# Patient Record
Sex: Female | Born: 1960 | Race: White | Hispanic: No | Marital: Married | Smoking: Never smoker
Health system: Southern US, Community
[De-identification: ages and names within clinical notes are randomized; demographics above are authoritative.]

## PROBLEM LIST (undated history)

## (undated) DIAGNOSIS — E669 Obesity, unspecified: Secondary | ICD-10-CM

## (undated) DIAGNOSIS — R5381 Other malaise: Secondary | ICD-10-CM

## (undated) DIAGNOSIS — E785 Hyperlipidemia, unspecified: Secondary | ICD-10-CM

## (undated) DIAGNOSIS — N943 Premenstrual tension syndrome: Secondary | ICD-10-CM

## (undated) DIAGNOSIS — E039 Hypothyroidism, unspecified: Secondary | ICD-10-CM

## (undated) DIAGNOSIS — I10 Essential (primary) hypertension: Secondary | ICD-10-CM

## (undated) DIAGNOSIS — Z0000 Encounter for general adult medical examination without abnormal findings: Secondary | ICD-10-CM

## (undated) DIAGNOSIS — I8290 Acute embolism and thrombosis of unspecified vein: Secondary | ICD-10-CM

## (undated) DIAGNOSIS — C189 Malignant neoplasm of colon, unspecified: Secondary | ICD-10-CM

## (undated) DIAGNOSIS — F329 Major depressive disorder, single episode, unspecified: Secondary | ICD-10-CM

## (undated) DIAGNOSIS — R5383 Other fatigue: Secondary | ICD-10-CM

## (undated) DIAGNOSIS — C801 Malignant (primary) neoplasm, unspecified: Secondary | ICD-10-CM

## (undated) DIAGNOSIS — R635 Abnormal weight gain: Secondary | ICD-10-CM

## (undated) HISTORY — DX: Obesity, unspecified: E66.9

## (undated) HISTORY — PX: SINUSOTOMY: SHX291

## (undated) HISTORY — DX: Essential (primary) hypertension: I10

## (undated) HISTORY — DX: Major depressive disorder, single episode, unspecified: F32.9

## (undated) HISTORY — DX: Hypothyroidism, unspecified: E03.9

## (undated) HISTORY — DX: Premenstrual tension syndrome: N94.3

## (undated) HISTORY — DX: Hyperlipidemia, unspecified: E78.5

## (undated) HISTORY — PX: APPENDECTOMY: SHX54

## (undated) HISTORY — PX: CHOLECYSTECTOMY: SHX55

## (undated) HISTORY — DX: Encounter for general adult medical examination without abnormal findings: Z00.00

## (undated) HISTORY — PX: LUMBAR DISC SURGERY: SHX700

## (undated) HISTORY — DX: Acute embolism and thrombosis of unspecified vein: I82.90

## (undated) HISTORY — DX: Other malaise: R53.81

## (undated) HISTORY — DX: Other fatigue: R53.83

## (undated) HISTORY — DX: Malignant neoplasm of colon, unspecified: C18.9

## (undated) HISTORY — DX: Abnormal weight gain: R63.5

## (undated) HISTORY — PX: TONSILLECTOMY: SUR1361

## (undated) HISTORY — PX: SHOULDER ARTHROSCOPY: SHX128

---
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
## 1998-01-27 ENCOUNTER — Ambulatory Visit (HOSPITAL_COMMUNITY): Admission: RE | Admit: 1998-01-27 | Discharge: 1998-01-27 | Payer: Self-pay | Admitting: Obstetrics and Gynecology

## 1998-03-24 ENCOUNTER — Ambulatory Visit (HOSPITAL_COMMUNITY): Admission: RE | Admit: 1998-03-24 | Discharge: 1998-03-24 | Payer: Self-pay | Admitting: Obstetrics and Gynecology

## 1998-06-14 ENCOUNTER — Inpatient Hospital Stay (HOSPITAL_COMMUNITY): Admission: AD | Admit: 1998-06-14 | Discharge: 1998-06-14 | Payer: Self-pay | Admitting: Obstetrics and Gynecology

## 1998-06-16 ENCOUNTER — Inpatient Hospital Stay (HOSPITAL_COMMUNITY): Admission: AD | Admit: 1998-06-16 | Discharge: 1998-06-20 | Payer: Self-pay | Admitting: Obstetrics and Gynecology

## 1999-09-07 ENCOUNTER — Emergency Department (HOSPITAL_COMMUNITY): Admission: EM | Admit: 1999-09-07 | Discharge: 1999-09-07 | Payer: Self-pay | Admitting: Emergency Medicine

## 1999-09-14 ENCOUNTER — Encounter: Admission: RE | Admit: 1999-09-14 | Discharge: 1999-09-14 | Payer: Self-pay | Admitting: Internal Medicine

## 1999-09-16 ENCOUNTER — Ambulatory Visit (HOSPITAL_COMMUNITY): Admission: RE | Admit: 1999-09-16 | Discharge: 1999-09-16 | Payer: Self-pay | Admitting: *Deleted

## 1999-09-18 ENCOUNTER — Encounter: Admission: RE | Admit: 1999-09-18 | Discharge: 1999-09-18 | Payer: Self-pay | Admitting: Internal Medicine

## 1999-09-21 ENCOUNTER — Ambulatory Visit: Admission: RE | Admit: 1999-09-21 | Discharge: 1999-09-21 | Payer: Self-pay | Admitting: *Deleted

## 1999-10-20 ENCOUNTER — Ambulatory Visit (HOSPITAL_COMMUNITY): Admission: RE | Admit: 1999-10-20 | Discharge: 1999-10-21 | Payer: Self-pay | Admitting: General Surgery

## 1999-10-20 ENCOUNTER — Encounter: Payer: Self-pay | Admitting: General Surgery

## 2000-02-02 ENCOUNTER — Other Ambulatory Visit: Admission: RE | Admit: 2000-02-02 | Discharge: 2000-02-02 | Payer: Self-pay | Admitting: Obstetrics and Gynecology

## 2001-04-17 ENCOUNTER — Encounter: Payer: Self-pay | Admitting: Obstetrics and Gynecology

## 2001-04-17 ENCOUNTER — Ambulatory Visit (HOSPITAL_COMMUNITY): Admission: RE | Admit: 2001-04-17 | Discharge: 2001-04-17 | Payer: Self-pay | Admitting: Obstetrics and Gynecology

## 2001-06-19 ENCOUNTER — Inpatient Hospital Stay (HOSPITAL_COMMUNITY): Admission: AD | Admit: 2001-06-19 | Discharge: 2001-06-19 | Payer: Self-pay | Admitting: Obstetrics and Gynecology

## 2001-07-12 ENCOUNTER — Inpatient Hospital Stay (HOSPITAL_COMMUNITY): Admission: AD | Admit: 2001-07-12 | Discharge: 2001-07-12 | Payer: Self-pay | Admitting: Obstetrics and Gynecology

## 2001-09-11 ENCOUNTER — Inpatient Hospital Stay (HOSPITAL_COMMUNITY): Admission: AD | Admit: 2001-09-11 | Discharge: 2001-09-13 | Payer: Self-pay | Admitting: Obstetrics and Gynecology

## 2001-09-11 ENCOUNTER — Encounter (INDEPENDENT_AMBULATORY_CARE_PROVIDER_SITE_OTHER): Payer: Self-pay | Admitting: Specialist

## 2003-10-03 ENCOUNTER — Emergency Department (HOSPITAL_COMMUNITY): Admission: EM | Admit: 2003-10-03 | Discharge: 2003-10-03 | Payer: Self-pay | Admitting: Emergency Medicine

## 2005-06-03 ENCOUNTER — Other Ambulatory Visit: Admission: RE | Admit: 2005-06-03 | Discharge: 2005-06-03 | Payer: Self-pay | Admitting: Obstetrics and Gynecology

## 2007-06-19 ENCOUNTER — Encounter: Payer: Self-pay | Admitting: Endocrinology

## 2007-07-19 ENCOUNTER — Encounter: Payer: Self-pay | Admitting: Endocrinology

## 2007-10-10 ENCOUNTER — Encounter: Payer: Self-pay | Admitting: Endocrinology

## 2007-10-26 ENCOUNTER — Encounter: Admission: RE | Admit: 2007-10-26 | Discharge: 2007-11-16 | Payer: Self-pay | Admitting: Family Medicine

## 2007-11-30 ENCOUNTER — Ambulatory Visit: Payer: Self-pay | Admitting: Endocrinology

## 2007-11-30 DIAGNOSIS — F3289 Other specified depressive episodes: Secondary | ICD-10-CM

## 2007-11-30 DIAGNOSIS — F329 Major depressive disorder, single episode, unspecified: Secondary | ICD-10-CM

## 2007-11-30 DIAGNOSIS — R635 Abnormal weight gain: Secondary | ICD-10-CM

## 2007-11-30 DIAGNOSIS — E039 Hypothyroidism, unspecified: Secondary | ICD-10-CM | POA: Insufficient documentation

## 2007-11-30 HISTORY — DX: Major depressive disorder, single episode, unspecified: F32.9

## 2007-11-30 HISTORY — DX: Abnormal weight gain: R63.5

## 2007-11-30 HISTORY — DX: Other specified depressive episodes: F32.89

## 2007-12-01 ENCOUNTER — Ambulatory Visit: Payer: Self-pay | Admitting: Endocrinology

## 2007-12-01 ENCOUNTER — Telehealth (INDEPENDENT_AMBULATORY_CARE_PROVIDER_SITE_OTHER): Payer: Self-pay | Admitting: *Deleted

## 2007-12-01 LAB — CONVERTED CEMR LAB: Cortisol, Plasma: 0.5 ug/dL

## 2007-12-04 ENCOUNTER — Telehealth (INDEPENDENT_AMBULATORY_CARE_PROVIDER_SITE_OTHER): Payer: Self-pay | Admitting: *Deleted

## 2008-04-08 ENCOUNTER — Ambulatory Visit: Payer: Self-pay | Admitting: Family Medicine

## 2008-04-09 ENCOUNTER — Ambulatory Visit: Payer: Self-pay | Admitting: Family Medicine

## 2008-04-16 ENCOUNTER — Ambulatory Visit: Payer: Self-pay | Admitting: Family Medicine

## 2008-04-23 ENCOUNTER — Ambulatory Visit: Payer: Self-pay | Admitting: Family Medicine

## 2008-04-25 ENCOUNTER — Ambulatory Visit: Payer: Self-pay | Admitting: Family Medicine

## 2008-04-26 ENCOUNTER — Encounter (INDEPENDENT_AMBULATORY_CARE_PROVIDER_SITE_OTHER): Payer: Self-pay | Admitting: *Deleted

## 2008-04-26 LAB — CONVERTED CEMR LAB
ALT: 20 units/L (ref 0–35)
Albumin: 4.3 g/dL (ref 3.5–5.2)
Alkaline Phosphatase: 78 units/L (ref 39–117)
BUN: 12 mg/dL (ref 6–23)
Bilirubin, Direct: 0.1 mg/dL (ref 0.0–0.3)
CO2: 28 meq/L (ref 19–32)
Eosinophils Relative: 1.3 % (ref 0.0–5.0)
GFR calc Af Amer: 140 mL/min
Glucose, Bld: 80 mg/dL (ref 70–99)
HCT: 42.3 % (ref 36.0–46.0)
Hemoglobin: 14.8 g/dL (ref 12.0–15.0)
Lymphocytes Relative: 26.3 % (ref 12.0–46.0)
Monocytes Absolute: 0.5 10*3/uL (ref 0.1–1.0)
Monocytes Relative: 8.9 % (ref 3.0–12.0)
Neutro Abs: 3.5 10*3/uL (ref 1.4–7.7)
Platelets: 240 10*3/uL (ref 150–400)
Potassium: 3.9 meq/L (ref 3.5–5.1)
Sodium: 138 meq/L (ref 135–145)
Total Protein: 7.6 g/dL (ref 6.0–8.3)
WBC: 5.7 10*3/uL (ref 4.5–10.5)

## 2008-04-30 ENCOUNTER — Ambulatory Visit: Payer: Self-pay | Admitting: Family Medicine

## 2008-05-07 ENCOUNTER — Ambulatory Visit: Payer: Self-pay | Admitting: Family Medicine

## 2008-05-07 ENCOUNTER — Telehealth: Payer: Self-pay | Admitting: Family Medicine

## 2008-05-08 ENCOUNTER — Ambulatory Visit: Payer: Self-pay | Admitting: Family Medicine

## 2008-05-13 LAB — CONVERTED CEMR LAB
ALT: 18 units/L (ref 0–35)
AST: 23 units/L (ref 0–37)
Alkaline Phosphatase: 71 units/L (ref 39–117)
Basophils Absolute: 0 10*3/uL (ref 0.0–0.1)
Bilirubin, Direct: 0.1 mg/dL (ref 0.0–0.3)
CO2: 28 meq/L (ref 19–32)
Calcium: 9.4 mg/dL (ref 8.4–10.5)
Chloride: 109 meq/L (ref 96–112)
Glucose, Bld: 86 mg/dL (ref 70–99)
Hemoglobin: 14.7 g/dL (ref 12.0–15.0)
Lymphocytes Relative: 31.2 % (ref 12.0–46.0)
MCHC: 34.7 g/dL (ref 30.0–36.0)
Monocytes Relative: 8.4 % (ref 3.0–12.0)
Neutro Abs: 3 10*3/uL (ref 1.4–7.7)
Neutrophils Relative %: 57.8 % (ref 43.0–77.0)
Platelets: 237 10*3/uL (ref 150–400)
Potassium: 4.2 meq/L (ref 3.5–5.1)
RDW: 13.7 % (ref 11.5–14.6)
Sodium: 141 meq/L (ref 135–145)
Total Bilirubin: 0.6 mg/dL (ref 0.3–1.2)
Total Protein: 7 g/dL (ref 6.0–8.3)

## 2008-05-14 ENCOUNTER — Encounter (INDEPENDENT_AMBULATORY_CARE_PROVIDER_SITE_OTHER): Payer: Self-pay | Admitting: *Deleted

## 2008-05-14 ENCOUNTER — Ambulatory Visit: Payer: Self-pay | Admitting: Family Medicine

## 2008-05-21 ENCOUNTER — Ambulatory Visit: Payer: Self-pay | Admitting: Family Medicine

## 2008-05-28 ENCOUNTER — Ambulatory Visit: Payer: Self-pay | Admitting: Family Medicine

## 2008-06-04 ENCOUNTER — Ambulatory Visit: Payer: Self-pay | Admitting: Family Medicine

## 2008-06-11 ENCOUNTER — Ambulatory Visit: Payer: Self-pay | Admitting: Family Medicine

## 2008-06-18 ENCOUNTER — Ambulatory Visit: Payer: Self-pay | Admitting: Family Medicine

## 2008-06-25 ENCOUNTER — Ambulatory Visit: Payer: Self-pay | Admitting: Family Medicine

## 2008-07-02 ENCOUNTER — Ambulatory Visit: Payer: Self-pay | Admitting: Family Medicine

## 2008-07-09 ENCOUNTER — Ambulatory Visit: Payer: Self-pay | Admitting: Family Medicine

## 2008-07-23 ENCOUNTER — Ambulatory Visit: Payer: Self-pay | Admitting: Family Medicine

## 2008-07-30 ENCOUNTER — Ambulatory Visit: Payer: Self-pay | Admitting: Family Medicine

## 2008-08-06 ENCOUNTER — Ambulatory Visit: Payer: Self-pay | Admitting: Family Medicine

## 2009-11-20 ENCOUNTER — Emergency Department (HOSPITAL_COMMUNITY): Admission: EM | Admit: 2009-11-20 | Discharge: 2009-11-20 | Payer: Self-pay | Admitting: Emergency Medicine

## 2010-10-04 LAB — CONVERTED CEMR LAB
AST: 26 units/L (ref 0–37)
Alkaline Phosphatase: 80 units/L (ref 39–117)
Bilirubin Urine: NEGATIVE
Bilirubin, Direct: 0.1 mg/dL (ref 0.0–0.3)
Blood in Urine, dipstick: NEGATIVE
Chloride: 107 meq/L (ref 96–112)
Cholesterol: 236 mg/dL (ref 0–200)
Folate: 11.8 ng/mL
GFR calc Af Amer: 140 mL/min
GFR calc non Af Amer: 115 mL/min
Glucose, Urine, Semiquant: NEGATIVE
HDL: 41.1 mg/dL (ref >39.0)
Hemoglobin: 13.5 g/dL (ref 12.0–15.0)
Ketones, urine, test strip: NEGATIVE
Lymphocytes Relative: 32.6 % (ref 12.0–46.0)
Monocytes Relative: 4.4 % (ref 3.0–12.0)
Neutro Abs: 2.9 10*3/uL (ref 1.4–7.7)
Neutrophils Relative %: 58.4 % (ref 43.0–77.0)
Nitrite: NEGATIVE
Platelets: 281 10*3/uL (ref 150–400)
Potassium: 3.9 meq/L (ref 3.5–5.1)
Protein, U semiquant: NEGATIVE
RDW: 13.1 % (ref 11.5–14.6)
Sodium: 140 meq/L (ref 135–145)
Specific Gravity, Urine: 1.01
TSH: 1.55 microintl units/mL (ref 0.35–5.50)
Total Bilirubin: 0.7 mg/dL (ref 0.3–1.2)
Total CHOL/HDL Ratio: 5.7
Uric Acid, Serum: 5.4 mg/dL (ref 2.4–7.0)
Urobilinogen, UA: NEGATIVE
VLDL: 19 mg/dL (ref 0–40)
Vitamin B-12: 523 pg/mL (ref 211–911)
WBC Urine, dipstick: NEGATIVE
pH: 6

## 2011-01-22 NOTE — Discharge Summary (Signed)
[facility]  Patient:    Kaitlyn Avila, Kaitlyn Avila Visit Number: [id number] MRN: [id number]          Service Type: OBS Location: 910A 9108 01 Attending Physician:  Michaele Offer Dictated by:   Zenaida Niece, M.D. Admit Date:  09/11/2001 Discharge Date: 09/13/2001                             Discharge Summary  ADMISSION DIAGNOSES:          1. Intrauterine pregnancy at 39 weeks.                               2. Previous cesarean section.                               3. Hypothyroidism.                               4. Advanced maternal age.  DISCHARGE DIAGNOSES:          1. Intrauterine pregnancy at 39 weeks.                               2. Previous cesarean section.                               3. Hypothyroidism.                               4. Advanced maternal age.  PROCEDURE:                    Repeat low transverse cesarean section.  COMPLICATIONS:                None.  CONSULTATIONS:                None.  HISTORY AND PHYSICAL:         Please see the chart for a full history and physical.  Briefly, this is a 50 year old white female gravida 2, para 1-0-0-1 at 33 weeks admitted for a repeat low transverse cesarean section, as the patient declines a VBAC.  She has had well-controlled hypothyroidism throughout her pregnancy.  She is advanced maternal age and declined amniocentesis but had a normal triple screen and normal ultrasound.  HOSPITAL COURSE:              The patient was admitted on the day of surgery and underwent a repeat low transverse cesarean section.  This was done under spinal anesthesia with an estimated blood loss of 800 cc.  She delivered a viable female with Apgars of 9 and 9 that weighed 8 pounds 10 ounces.  The patient had normal anatomy except for omentum adherent to the bladder flap and a thick lower uterine segment.  Postoperatively, she did very well and was rapidly able to ambulate and tolerate a regular diet  and remained afebrile. Preoperative hemoglobin was 12.4, postoperative was 12.0.  On the morning of postoperative #2, the patient requested discharge home.  Her incision was healing well at that time.  She was felt to  be stable enough for discharge.  CONDITION ON DISCHARGE:       Stable.  DISPOSITION:                  Discharged to home.  DISCHARGE INSTRUCTIONS:       Her diet is a regular diet and her activity is no strenuous activity, no driving, and pelvic rest.  Followup is supposed to be January 13 for staple removal.  Medication is Percocet p.r.n. pain and she is given our discharge pamphlet. Dictated by:   Zenaida Niece, M.D. Attending Physician:  Michaele Offer DD:  09/26/01 TD:  09/27/01 Job: [id number]

## 2011-01-22 NOTE — Op Note (Signed)
[facility]  Patient:    Kaitlyn Avila, Kaitlyn Avila Visit Number: [id number] MRN: [id number]          Service Type: OBS Location: 910B 9198 01 Attending Physician:  Michaele Offer Dictated by:   Zenaida Niece, M.D. Proc. Date: 09/11/01 Admit Date:  09/11/2001                             Operative Report  PREOPERATIVE DIAGNOSES:       Intrauterine pregnancy at 39 weeks, previous cesarean section, hypothyroidism, advanced maternal age.  POSTOPERATIVE DIAGNOSES:      Intrauterine pregnancy at 39 weeks, previous cesarean section, hypothyroidism, advanced maternal age.  PROCEDURE:                    Repeat low transverse cesarean section.  SURGEON:                      Zenaida Niece, M.D.  ASSISTANT:                    Huel Cote, M.D.  ANESTHESIA:                   Spinal.  ESTIMATED BLOOD LOSS:         800 cc.  FINDINGS:                     Viable female infant with Apgars 9/9, weight 8 pounds 10 ounces.  Patient had normal anatomy except for the omentum was slightly adherent to the bladder flap and she had a very thick lower uterine segment.  PROCEDURE IN DETAIL:          Patient was taken to the operating room and placed in the sitting position.  Dr. Arby Barrette then instilled spinal anesthesia and she was placed in the dorsal supine position with left lateral tilt.  Her abdomen was then prepped and draped in the usual sterile fashion and a Foley catheter inserted.  The level of anesthesia was found to be adequate and her abdomen was entered via her previous Pfannenstiel incision.  Omentum was noted to be adherent to the bladder flap and this was dissected away sharply and with electrocautery.  The vesicouterine peritoneum was incised and bladder flap created digitally.  A 4 cm transverse incision was made in the thick lower uterine segment.  The amniotic cavity was entered bluntly with clear amniotic fluid.  The incision was  extended bilaterally digitally and with bandage scissors.  The fetal vertex was then grasped and delivered through the incision atraumatically.  The mouth and nares were suctioned and the remainder of the infant delivered atraumatically.  The cord was doubly clamped and cut and the infant handed to the awaiting pediatric team.  Cord blood and a segment of cord for gas were obtained and the placenta delivered manually. The uterus was then wiped dry with a clean lap pad and all clots and debris removed.  The cervix was dilated with a long ring forceps.  The uterine incision was inspected and found to be free of extensions.  This thick lower uterine segment was then repaired in one layer being a running locking layer with #1 chromic with adequate hemostasis.  Bleeding from serosal edges was controlled with electrocautery.  The bladder was dissected inferiorly sharply and with electrocautery to assure that it was inferior from the incision.  Both tubes and ovaries were then inspected and found to be normal.  The uterine incision was again inspected and found to be hemostatic.  The subfascial space was irrigated and made hemostatic with electrocautery.  The fascia was closed in a running fashion starting at both ends and meeting in the middle with 0 Vicryl.  The subcutaneous tissue was irrigated and made hemostatic with electrocautery.  The skin was then closed with staples and a sterile dressing.  Patient tolerated the procedure well.  All counts were correct and she received Ancef 1 g after cord clamp.  She was taken to the recovery room in stable condition. Dictated by:   Zenaida Niece, M.D. Attending Physician:  Michaele Offer DD:  09/11/01 TD:  09/11/01 Job: [id number]

## 2011-01-22 NOTE — H&P (Signed)
[facility]  Patient:    Kaitlyn Avila, Kaitlyn Avila Visit Number: [id number] MRN: [id number]          Service Type: OBS Location: [facility] Attending Physician:  Michaele Offer Dictated by:   Zenaida Niece, M.D. Admit Date:  07/12/2001 Discharge Date: 07/12/2001                           History and Physical  CHIEF COMPLAINT:              Repeat cesarean section.  HISTORY OF PRESENT ILLNESS:   This is a 50 year old white female, gravida 2 para 1, 0-0-1, with an EGA of [redacted] weeks by an LMP consistent with an eight week ultrasound, with a due date of September 18, 2001, who presents for repeat cesarean section.  The patient has had one prior low transverse cesarean section and is cleared for a trial of labor but declines VBAC, and is admitted for repeat cesarean section.  Prenatal care has been essentially uncomplicated.  She has been stable on Synthroid for hypothyroidism and has had no significant complications.  PRENATAL LABORATORY DATA:     Blood type is A-negative, with a negative antibody screen.  RPR nonreactive.  Rubella immune.  Hepatitis B surface antigen negative.  HIV negative.  Gonorrhea and Chlamydia negative.  Triple screen normal.  One hour glucola 118.  Group B strep not done given she is having repeat cesarean section.  PAST OBSTETRICAL HISTORY:     In 1999 she had a low transverse cesarean section for arrest of dilation.  This was at 40 weeks.  The baby weighed 8 pounds 1 ounce.  There were no other complications.  PAST MEDICAL HISTORY:         1. History of a heart murmur, with a normal                                  echocardiogram.                               2. Hypothyroidism.  PAST SURGICAL HISTORY:        1. Appendectomy in 1983.                               2. Abdominal cyst removed in 1998.                               3. Cesarean section in 1999.                               4. Cholecystectomy in 2001.  ALLERGIES:                     ERYTHROMYCIN.  MEDICATIONS:                  Synthroid 100 mcg p.o. q.d.  SOCIAL HISTORY:               The patient is married.  Denies alcohol, tobacco, or drug use.  FAMILY HISTORY:               Noncontributory.  REVIEW  OF SYSTEMS:            Otherwise negative.  PHYSICAL EXAMINATION:  GENERAL:                      This is a gravid female, in mild distress.  VITAL SIGNS:                  Weight 224 pounds.  Blood pressure 180/40.  HEENT:                        PERRLA.  EOMI.  Oropharynx clear without erythema or exudate.  NECK:                         Supple without lymphadenopathy or thyromegaly.  LUNGS:                        Clear to auscultation.  HEART:                        Regular rate and rhythm without murmur.  ABDOMEN:                      Gravid, fundal height 39 cm.  Nontender. Well-healed vertical and Pfannenstiel scars.  EXTREMITIES:                  Edema 3+, nontender.  NEUROLOGIC:                   DTRs 2/4 and symmetric.  PELVIC:                       On vaginal examination closed, 30, and -3 with vertex presentation.  ASSESSMENT:                   1. Intrauterine pregnancy at 39 weeks, with                                  prior cesarean section.  Patient declines                                  trial of labor and is admitted for repeat                                  cesarean section.                               2. Hypothyroidism, well controlled with                                  medication.                               3. Advanced maternal age, for which she had a                                  normal  triple screen and a normal                                  ultrasound, and declined amniocentesis.  PLAN:                         Risks of repeat cesarean section have been discussed with the patient and she agrees to proceed.  Plan to admit the patient for repeat cesarean section. Dictated by:   Zenaida Niece,  M.D. Attending Physician:  Michaele Offer DD:  09/08/01 TD:  09/09/01 Job: [id number]

## 2011-01-26 ENCOUNTER — Other Ambulatory Visit: Payer: Self-pay | Admitting: Otolaryngology

## 2011-01-26 ENCOUNTER — Ambulatory Visit
Admission: RE | Admit: 2011-01-26 | Discharge: 2011-01-26 | Disposition: A | Discharge status: Home / Self Care | Payer: BC Managed Care – PPO | Source: Ambulatory Visit | Attending: Otolaryngology | Admitting: Otolaryngology

## 2011-03-11 ENCOUNTER — Encounter (HOSPITAL_BASED_OUTPATIENT_CLINIC_OR_DEPARTMENT_OTHER)
Admission: RE | Admit: 2011-03-11 | Discharge: 2011-03-11 | Disposition: A | Discharge status: Home / Self Care | Payer: BC Managed Care – PPO | Source: Ambulatory Visit | Attending: Otolaryngology | Admitting: Otolaryngology

## 2011-03-11 LAB — POCT I-STAT, CHEM 8
BUN: 11 mg/dL (ref 6–23)
Calcium, Ion: 1.14 mmol/L (ref 1.12–1.32)
Chloride: 100 mEq/L (ref 96–112)
Glucose, Bld: 97 mg/dL (ref 70–99)
HCT: 42 % (ref 36.0–46.0)
Potassium: 3.4 mEq/L — ABNORMAL LOW (ref 3.5–5.1)

## 2011-03-12 ENCOUNTER — Ambulatory Visit (HOSPITAL_BASED_OUTPATIENT_CLINIC_OR_DEPARTMENT_OTHER)
Admission: RE | Admit: 2011-03-12 | Discharge: 2011-03-12 | Disposition: A | Discharge status: Home / Self Care | Payer: BC Managed Care – PPO | Source: Ambulatory Visit | Attending: Otolaryngology | Admitting: Otolaryngology

## 2011-03-12 ENCOUNTER — Other Ambulatory Visit: Payer: Self-pay | Admitting: Otolaryngology

## 2011-03-12 DIAGNOSIS — J322 Chronic ethmoidal sinusitis: Secondary | ICD-10-CM | POA: Insufficient documentation

## 2011-03-12 DIAGNOSIS — Z01810 Encounter for preprocedural cardiovascular examination: Secondary | ICD-10-CM | POA: Insufficient documentation

## 2011-03-12 DIAGNOSIS — Z01812 Encounter for preprocedural laboratory examination: Secondary | ICD-10-CM | POA: Insufficient documentation

## 2011-03-12 DIAGNOSIS — J320 Chronic maxillary sinusitis: Secondary | ICD-10-CM | POA: Insufficient documentation

## 2011-03-12 DIAGNOSIS — J0120 Acute ethmoidal sinusitis, unspecified: Secondary | ICD-10-CM | POA: Insufficient documentation

## 2011-03-12 DIAGNOSIS — J0100 Acute maxillary sinusitis, unspecified: Secondary | ICD-10-CM | POA: Insufficient documentation

## 2011-03-12 DIAGNOSIS — J343 Hypertrophy of nasal turbinates: Secondary | ICD-10-CM | POA: Insufficient documentation

## 2011-03-31 NOTE — Op Note (Signed)
NAMEMIGNON, BECHLER                 ACCOUNT NO.:  [id number]  MEDICAL RECORD NO.:  [id number]  LOCATION:                                 FACILITY:  PHYSICIAN:  Kristine Garbe. Ezzard Standing, M.D. DATE OF BIRTH:  DATE OF PROCEDURE:  03/12/2011 DATE OF DISCHARGE:                              OPERATIVE REPORT   PREOPERATIVE DIAGNOSES: 1. Chronic and recurrent acute sinusitis with bilateral ethmoiditis     and bilateral maxillary ostium air fluid levels. 2. Turbinate hypertrophy.  POSTOPERATIVE DIAGNOSES: 1. Chronic and recurrent acute sinusitis with bilateral ethmoiditis     and bilateral maxillary ostium air fluid levels. 2. Turbinate hypertrophy.  OPERATION PERFORMED: 1. Functional endoscopic sinus surgery with bilateral total     ethmoidectomies. 2. Bilateral maxillary ostium enlargement with irrigation of maxillary     sinuses. 3. Bilateral inferior turbinate reductions with a [employer] turbinate     blade.  SURGEON:  Kristine Garbe. Ezzard Standing, MD  ANESTHESIA:  General endotracheal.  COMPLICATIONS:  None.  BRIEF CLINICAL NOTE:  Kaitlyn Avila is a 50 year old female who has been having chronic sinus problems dating back to November, December 2011. She had been treated with 5 or 6 rounds of antibiotics since that time. After completing antibiotic Augmentin, she had another CT scan which showed chronic and acute bilateral ethmoid and maxillary sinus disease, frontal sinuses although very small were clear.  She had a minimal mucosal thickening within the sphenoid region.  She has had previous history of trauma to her nose on the right side and slightly depressed right nasal bone fracture.  She has a relatively narrow nose with a large turbinates, but there are no polyps noted.  Because of history of recurrent sinus infections requiring several antibiotics this past 8 months, she was taken to the operating room at this time for functional endoscopic sinus surgery and turbinate  reductions.  She is presently completing another round of Augmentin.  DESCRIPTION OF PROCEDURE:  After adequate endotracheal anesthesia, the patient received 1 g Ancef IV preoperatively.  Nose was prepped with cotton pledgets soaked in Afrin and middle meatus, turbinates were injected with Xylocaine with epinephrine for hemostasis.  She had a fair amount of congestion prior to decongesting the nose.  There was no obvious mucopurulent discharge noted from middle meatus and no polyps. First, the right side was approached.  Uncinate process was incised with sickle knife.  The anterior and posterior ethmoid cells were opened up with the straight through cup and up through cup forceps.  There was some mucosal thickening within the anterior ethmoid area.  The posterior ethmoid cells were fairly clear.  Microdebrider was used to large open up the ethmoid area, taking care to preserve the lamina and the roof of the ethmoid area.  The maxillary ostium was identified and was enlarged with straight through cup and backbiting forceps.  The mucosa within the maxillary sinus was well thickened along the floor, but no gross mucopurulent discharge.  Next, the left side was approached in similar fashion.  The left side had a little bit more swelling, inflammation of the anterior ethmoid area which was opened with the straight through cup and up through  cup forceps as well as a microdebrider.  She had a small accessory maxillary ostium posteriorly on the left side and the natural ostium was enlarged to incorporate the accessory ostium more posteriorly.  Again, the posterior ethmoid area on the left side was likewise fairly clear with minimal disease, although the anterior ethmoid area on the left side had a fair amount of thickened mucosa. This completed the ethmoidectomy, maxillary ostium enlargement.  The maxillary sinuses were then irrigated with saline as was the ethmoid area.  Following this, the  turbinate blade was used to perform bilateral submucosal turbinate reductions of the inferior turbinates and the turbinates were outfractured.  This completed the turbinate reductions. Kennedy sinus packs were placed within the ethmoid areas bilaterally and hydrated with saline.  Ms. Sitara was awoken from anesthesia and transferred to the recovery room, postop doing well.  DISPOSITION:  Ms. Nuha was discharged home later this morning.  We will continue with her Augmentin 875 b.i.d. for the next 5 days.  We will have her follow up in my office in 3 days for recheck and to have the Kennedy sinus packs removed.  She was given Vicodin 1-2 q.4 hours p.r.n. pain along with Tylenol or Motrin as needed.          ______________________________ Kristine Garbe Ezzard Standing, M.D.     CEN/MEDQ  D:  03/12/2011  T:  03/12/2011  Job:  [id number]  Electronically Signed by Dillard Cannon M.D. on 03/31/2011 11:39:53 AM

## 2012-05-16 ENCOUNTER — Ambulatory Visit (HOSPITAL_BASED_OUTPATIENT_CLINIC_OR_DEPARTMENT_OTHER)
Admission: RE | Admit: 2012-05-16 | Discharge: 2012-05-16 | Disposition: A | Discharge status: Home / Self Care | Payer: BC Managed Care – PPO | Source: Ambulatory Visit | Attending: Family Medicine | Admitting: Family Medicine

## 2012-05-16 ENCOUNTER — Other Ambulatory Visit (HOSPITAL_BASED_OUTPATIENT_CLINIC_OR_DEPARTMENT_OTHER): Payer: Self-pay | Admitting: Family Medicine

## 2012-05-16 DIAGNOSIS — M549 Dorsalgia, unspecified: Secondary | ICD-10-CM

## 2012-05-16 DIAGNOSIS — M545 Low back pain, unspecified: Secondary | ICD-10-CM | POA: Insufficient documentation

## 2012-05-16 DIAGNOSIS — R209 Unspecified disturbances of skin sensation: Secondary | ICD-10-CM | POA: Insufficient documentation

## 2012-05-16 DIAGNOSIS — M79609 Pain in unspecified limb: Secondary | ICD-10-CM | POA: Insufficient documentation

## 2012-05-16 DIAGNOSIS — M5126 Other intervertebral disc displacement, lumbar region: Secondary | ICD-10-CM | POA: Insufficient documentation

## 2012-09-06 LAB — HM PAP SMEAR

## 2013-02-07 ENCOUNTER — Encounter: Payer: Self-pay | Admitting: Family Medicine

## 2013-02-07 ENCOUNTER — Ambulatory Visit (INDEPENDENT_AMBULATORY_CARE_PROVIDER_SITE_OTHER): Payer: BC Managed Care – PPO | Admitting: Family Medicine

## 2013-02-07 VITALS — BP 126/86 | HR 78 | Ht 62.5 in | Wt 188.0 lb

## 2013-02-07 DIAGNOSIS — E669 Obesity, unspecified: Secondary | ICD-10-CM

## 2013-02-07 DIAGNOSIS — R5381 Other malaise: Secondary | ICD-10-CM

## 2013-02-07 DIAGNOSIS — E039 Hypothyroidism, unspecified: Secondary | ICD-10-CM

## 2013-02-07 DIAGNOSIS — E785 Hyperlipidemia, unspecified: Secondary | ICD-10-CM

## 2013-02-07 DIAGNOSIS — Z0000 Encounter for general adult medical examination without abnormal findings: Secondary | ICD-10-CM

## 2013-02-07 DIAGNOSIS — I10 Essential (primary) hypertension: Secondary | ICD-10-CM

## 2013-02-07 DIAGNOSIS — R5383 Other fatigue: Secondary | ICD-10-CM

## 2013-02-07 LAB — CBC
HCT: 43.3 % (ref 36.0–46.0)
Hemoglobin: 14.9 g/dL (ref 12.0–15.0)
MCH: 29.3 pg (ref 26.0–34.0)
MCHC: 34.4 g/dL (ref 30.0–36.0)
MCV: 85.1 fL (ref 78.0–100.0)
Platelets: 358 10*3/uL (ref 150–400)
RBC: 5.09 MIL/uL (ref 3.87–5.11)
RDW: 14.3 % (ref 11.5–15.5)
WBC: 5.4 10*3/uL (ref 4.0–10.5)

## 2013-02-07 LAB — POCT URINALYSIS DIPSTICK
Bilirubin, UA: NEGATIVE
Blood, UA: NEGATIVE
Glucose, UA: NEGATIVE
Ketones, UA: NEGATIVE
Leukocytes, UA: NEGATIVE
Nitrite, UA: NEGATIVE
Protein, UA: NEGATIVE
Spec Grav, UA: <=1.005
Urobilinogen, UA: NEGATIVE
pH, UA: 7.5

## 2013-02-07 LAB — COMPLETE METABOLIC PANEL WITH GFR
ALT: 17 U/L (ref 0–35)
AST: 19 U/L (ref 0–37)
Albumin: 4.4 g/dL (ref 3.5–5.2)
Alkaline Phosphatase: 90 U/L (ref 39–117)
BUN: 6 mg/dL (ref 6–23)
CO2: 27 mEq/L (ref 19–32)
Calcium: 9.6 mg/dL (ref 8.4–10.5)
Chloride: 100 mEq/L (ref 96–112)
Creat: 0.69 mg/dL (ref 0.50–1.10)
GFR, Est African American: >89 mL/min
GFR, Est Non African American: >89 mL/min
Glucose, Bld: 83 mg/dL (ref 70–99)
Potassium: 3.8 mEq/L (ref 3.5–5.3)
Sodium: 140 mEq/L (ref 135–145)
Total Bilirubin: 0.7 mg/dL (ref 0.3–1.2)
Total Protein: 7 g/dL (ref 6.0–8.3)

## 2013-02-07 LAB — LIPID PANEL
Cholesterol: 217 mg/dL — ABNORMAL HIGH (ref 0–200)
HDL: 52 mg/dL (ref >39)
LDL Cholesterol: 144 mg/dL — ABNORMAL HIGH (ref 0–99)
Total CHOL/HDL Ratio: 4.2 Ratio
Triglycerides: 105 mg/dL (ref <150)
VLDL: 21 mg/dL (ref 0–40)

## 2013-02-07 MED ORDER — LEVOTHYROXINE SODIUM 125 MCG PO TABS: 125.0000 ug | ORAL_TABLET | Freq: Every day | ORAL | Status: DC

## 2013-02-07 MED ORDER — PHENDIMETRAZINE TARTRATE 35 MG PO TABS: 1.0000 | ORAL_TABLET | Freq: Three times a day (TID) | ORAL | Status: DC

## 2013-02-07 MED ORDER — TRIAMTERENE-HCTZ 37.5-25 MG PO TABS: 1.0000 | ORAL_TABLET | Freq: Every day | ORAL | Status: DC

## 2013-02-07 NOTE — Patient Instructions (Addendum)
Preventive Care for Adults, Female A healthy lifestyle and preventive care can promote health and wellness. Preventive health guidelines for women include the following key practices.  A routine yearly physical is a good way to check with your caregiver about your health and preventive screening. It is a chance to share any concerns and updates on your health, and to receive a thorough exam.  Visit your dentist for a routine exam and preventive care every 6 months. Brush your teeth twice a day and floss once a day. Good oral hygiene prevents tooth decay and gum disease.  The frequency of eye exams is based on your age, health, family medical history, use of contact lenses, and other factors. Follow your caregiver's recommendations for frequency of eye exams.  Eat a healthy diet. Foods like vegetables, fruits, whole grains, low-fat dairy products, and lean protein foods contain the nutrients you need without too many calories. Decrease your intake of foods high in solid fats, added sugars, and salt. Eat the right amount of calories for you.Get information about a proper diet from your caregiver, if necessary.  Regular physical exercise is one of the most important things you can do for your health. Most adults should get at least 150 minutes of moderate-intensity exercise (any activity that increases your heart rate and causes you to sweat) each week. In addition, most adults need muscle-strengthening exercises on 2 or more days a week.  Maintain a healthy weight. The body mass index (BMI) is a screening tool to identify possible weight problems. It provides an estimate of body fat based on height and weight. Your caregiver can help determine your BMI, and can help you achieve or maintain a healthy weight.For adults 20 years and older:  A BMI below 18.5 is considered underweight.  A BMI of 18.5 to 24.9 is normal.  A BMI of 25 to 29.9 is considered overweight.  A BMI of 30 and above is  considered obese.  Maintain normal blood lipids and cholesterol levels by exercising and minimizing your intake of saturated fat. Eat a balanced diet with plenty of fruit and vegetables. Blood tests for lipids and cholesterol should begin at age 20 and be repeated every 5 years. If your lipid or cholesterol levels are high, you are over 50, or you are at high risk for heart disease, you may need your cholesterol levels checked more frequently.Ongoing high lipid and cholesterol levels should be treated with medicines if diet and exercise are not effective.  If you smoke, find out from your caregiver how to quit. If you do not use tobacco, do not start.  If you are pregnant, do not drink alcohol. If you are breastfeeding, be very cautious about drinking alcohol. If you are not pregnant and choose to drink alcohol, do not exceed 1 drink per day. One drink is considered to be 12 ounces (355 mL) of beer, 5 ounces (148 mL) of wine, or 1.5 ounces (44 mL) of liquor.  Avoid use of street drugs. Do not share needles with anyone. Ask for help if you need support or instructions about stopping the use of drugs.  High blood pressure causes heart disease and increases the risk of stroke. Your blood pressure should be checked at least every 1 to 2 years. Ongoing high blood pressure should be treated with medicines if weight loss and exercise are not effective.  If you are 55 to 50 years old, ask your caregiver if you should take aspirin to prevent strokes.  Diabetes   screening involves taking a blood sample to check your fasting blood sugar level. This should be done once every 3 years, after age 45, if you are within normal weight and without risk factors for diabetes. Testing should be considered at a younger age or be carried out more frequently if you are overweight and have at least 1 risk factor for diabetes.  Breast cancer screening is essential preventive care for women. You should practice "breast  self-awareness." This means understanding the normal appearance and feel of your breasts and may include breast self-examination. Any changes detected, no matter how small, should be reported to a caregiver. Women in their 20s and 30s should have a clinical breast exam (CBE) by a caregiver as part of a regular health exam every 1 to 3 years. After age 40, women should have a CBE every year. Starting at age 40, women should consider having a mammography (breast X-ray test) every year. Women who have a family history of breast cancer should talk to their caregiver about genetic screening. Women at a high risk of breast cancer should talk to their caregivers about having magnetic resonance imaging (MRI) and a mammography every year.  The Pap test is a screening test for cervical cancer. A Pap test can show cell changes on the cervix that might become cervical cancer if left untreated. A Pap test is a procedure in which cells are obtained and examined from the lower end of the uterus (cervix).  Women should have a Pap test starting at age 21.  Between ages 21 and 29, Pap tests should be repeated every 2 years.  Beginning at age 30, you should have a Pap test every 3 years as long as the past 3 Pap tests have been normal.  Some women have medical problems that increase the chance of getting cervical cancer. Talk to your caregiver about these problems. It is especially important to talk to your caregiver if a new problem develops soon after your last Pap test. In these cases, your caregiver may recommend more frequent screening and Pap tests.  The above recommendations are the same for women who have or have not gotten the vaccine for human papillomavirus (HPV).  If you had a hysterectomy for a problem that was not cancer or a condition that could lead to cancer, then you no longer need Pap tests. Even if you no longer need a Pap test, a regular exam is a good idea to make sure no other problems are  starting.  If you are between ages 65 and 70, and you have had normal Pap tests going back 10 years, you no longer need Pap tests. Even if you no longer need a Pap test, a regular exam is a good idea to make sure no other problems are starting.  If you have had past treatment for cervical cancer or a condition that could lead to cancer, you need Pap tests and screening for cancer for at least 20 years after your treatment.  If Pap tests have been discontinued, risk factors (such as a new sexual partner) need to be reassessed to determine if screening should be resumed.  The HPV test is an additional test that may be used for cervical cancer screening. The HPV test looks for the virus that can cause the cell changes on the cervix. The cells collected during the Pap test can be tested for HPV. The HPV test could be used to screen women aged 30 years and older, and should   be used in women of any age who have unclear Pap test results. After the age of 30, women should have HPV testing at the same frequency as a Pap test.  Colorectal cancer can be detected and often prevented. Most routine colorectal cancer screening begins at the age of 50 and continues through age 75. However, your caregiver may recommend screening at an earlier age if you have risk factors for colon cancer. On a yearly basis, your caregiver may provide home test kits to check for hidden blood in the stool. Use of a small camera at the end of a tube, to directly examine the colon (sigmoidoscopy or colonoscopy), can detect the earliest forms of colorectal cancer. Talk to your caregiver about this at age 50, when routine screening begins. Direct examination of the colon should be repeated every 5 to 10 years through age 75, unless early forms of pre-cancerous polyps or small growths are found.  Hepatitis C blood testing is recommended for all people born from 1945 through 1965 and any individual with known risks for hepatitis C.  Practice  safe sex. Use condoms and avoid high-risk sexual practices to reduce the spread of sexually transmitted infections (STIs). STIs include gonorrhea, chlamydia, syphilis, trichomonas, herpes, HPV, and human immunodeficiency virus (HIV). Herpes, HIV, and HPV are viral illnesses that have no cure. They can result in disability, cancer, and death. Sexually active women aged 25 and younger should be checked for chlamydia. Older women with new or multiple partners should also be tested for chlamydia. Testing for other STIs is recommended if you are sexually active and at increased risk.  Osteoporosis is a disease in which the bones lose minerals and strength with aging. This can result in serious bone fractures. The risk of osteoporosis can be identified using a bone density scan. Women ages 65 and over and women at risk for fractures or osteoporosis should discuss screening with their caregivers. Ask your caregiver whether you should take a calcium supplement or vitamin D to reduce the rate of osteoporosis.  Menopause can be associated with physical symptoms and risks. Hormone replacement therapy is available to decrease symptoms and risks. You should talk to your caregiver about whether hormone replacement therapy is right for you.  Use sunscreen with sun protection factor (SPF) of 30 or more. Apply sunscreen liberally and repeatedly throughout the day. You should seek shade when your shadow is shorter than you. Protect yourself by wearing long sleeves, pants, a wide-brimmed hat, and sunglasses year round, whenever you are outdoors.  Once a month, do a whole body skin exam, using a mirror to look at the skin on your back. Notify your caregiver of new moles, moles that have irregular borders, moles that are larger than a pencil eraser, or moles that have changed in shape or color.  Stay current with required immunizations.  Influenza. You need a dose every fall (or winter). The composition of the flu vaccine  changes each year, so being vaccinated once is not enough.  Pneumococcal polysaccharide. You need 1 to 2 doses if you smoke cigarettes or if you have certain chronic medical conditions. You need 1 dose at age 65 (or older) if you have never been vaccinated.  Tetanus, diphtheria, pertussis (Tdap, Td). Get 1 dose of Tdap vaccine if you are younger than age 65, are over 65 and have contact with an infant, are a healthcare worker, are pregnant, or simply want to be protected from whooping cough. After that, you need a Td   booster dose every 10 years. Consult your caregiver if you have not had at least 3 tetanus and diphtheria-containing shots sometime in your life or have a deep or dirty wound.  HPV. You need this vaccine if you are a woman age 26 or younger. The vaccine is given in 3 doses over 6 months.  Measles, mumps, rubella (MMR). You need at least 1 dose of MMR if you were born in 1957 or later. You may also need a second dose.  Meningococcal. If you are age 19 to 21 and a first-year college student living in a residence hall, or have one of several medical conditions, you need to get vaccinated against meningococcal disease. You may also need additional booster doses.  Zoster (shingles). If you are age 60 or older, you should get this vaccine.  Varicella (chickenpox). If you have never had chickenpox or you were vaccinated but received only 1 dose, talk to your caregiver to find out if you need this vaccine.  Hepatitis A. You need this vaccine if you have a specific risk factor for hepatitis A virus infection or you simply wish to be protected from this disease. The vaccine is usually given as 2 doses, 6 to 18 months apart.  Hepatitis B. You need this vaccine if you have a specific risk factor for hepatitis B virus infection or you simply wish to be protected from this disease. The vaccine is given in 3 doses, usually over 6 months. Preventive Services / Frequency Ages 19 to 39  Blood  pressure check.** / Every 1 to 2 years.  Lipid and cholesterol check.** / Every 5 years beginning at age 20.  Clinical breast exam.** / Every 3 years for women in their 20s and 30s.  Pap test.** / Every 2 years from ages 21 through 29. Every 3 years starting at age 30 through age 65 or 70 with a history of 3 consecutive normal Pap tests.  HPV screening.** / Every 3 years from ages 30 through ages 65 to 70 with a history of 3 consecutive normal Pap tests.  Hepatitis C blood test.** / For any individual with known risks for hepatitis C.  Skin self-exam. / Monthly.  Influenza immunization.** / Every year.  Pneumococcal polysaccharide immunization.** / 1 to 2 doses if you smoke cigarettes or if you have certain chronic medical conditions.  Tetanus, diphtheria, pertussis (Tdap, Td) immunization. / A one-time dose of Tdap vaccine. After that, you need a Td booster dose every 10 years.  HPV immunization. / 3 doses over 6 months, if you are 26 and younger.  Measles, mumps, rubella (MMR) immunization. / You need at least 1 dose of MMR if you were born in 1957 or later. You may also need a second dose.  Meningococcal immunization. / 1 dose if you are age 19 to 21 and a first-year college student living in a residence hall, or have one of several medical conditions, you need to get vaccinated against meningococcal disease. You may also need additional booster doses.  Varicella immunization.** / Consult your caregiver.  Hepatitis A immunization.** / Consult your caregiver. 2 doses, 6 to 18 months apart.  Hepatitis B immunization.** / Consult your caregiver. 3 doses usually over 6 months. Ages 40 to 64  Blood pressure check.** / Every 1 to 2 years.  Lipid and cholesterol check.** / Every 5 years beginning at age 20.  Clinical breast exam.** / Every year after age 40.  Mammogram.** / Every year beginning at age 40   and continuing for as long as you are in good health. Consult with your  caregiver.  Pap test.** / Every 3 years starting at age 30 through age 65 or 70 with a history of 3 consecutive normal Pap tests.  HPV screening.** / Every 3 years from ages 30 through ages 65 to 70 with a history of 3 consecutive normal Pap tests.  Fecal occult blood test (FOBT) of stool. / Every year beginning at age 50 and continuing until age 75. You may not need to do this test if you get a colonoscopy every 10 years.  Flexible sigmoidoscopy or colonoscopy.** / Every 5 years for a flexible sigmoidoscopy or every 10 years for a colonoscopy beginning at age 50 and continuing until age 75.  Hepatitis C blood test.** / For all people born from 1945 through 1965 and any individual with known risks for hepatitis C.  Skin self-exam. / Monthly.  Influenza immunization.** / Every year.  Pneumococcal polysaccharide immunization.** / 1 to 2 doses if you smoke cigarettes or if you have certain chronic medical conditions.  Tetanus, diphtheria, pertussis (Tdap, Td) immunization.** / A one-time dose of Tdap vaccine. After that, you need a Td booster dose every 10 years.  Measles, mumps, rubella (MMR) immunization. / You need at least 1 dose of MMR if you were born in 1957 or later. You may also need a second dose.  Varicella immunization.** / Consult your caregiver.  Meningococcal immunization.** / Consult your caregiver.  Hepatitis A immunization.** / Consult your caregiver. 2 doses, 6 to 18 months apart.  Hepatitis B immunization.** / Consult your caregiver. 3 doses, usually over 6 months. Ages 65 and over  Blood pressure check.** / Every 1 to 2 years.  Lipid and cholesterol check.** / Every 5 years beginning at age 20.  Clinical breast exam.** / Every year after age 40.  Mammogram.** / Every year beginning at age 40 and continuing for as long as you are in good health. Consult with your caregiver.  Pap test.** / Every 3 years starting at age 30 through age 65 or 70 with a 3  consecutive normal Pap tests. Testing can be stopped between 65 and 70 with 3 consecutive normal Pap tests and no abnormal Pap or HPV tests in the past 10 years.  HPV screening.** / Every 3 years from ages 30 through ages 65 or 70 with a history of 3 consecutive normal Pap tests. Testing can be stopped between 65 and 70 with 3 consecutive normal Pap tests and no abnormal Pap or HPV tests in the past 10 years.  Fecal occult blood test (FOBT) of stool. / Every year beginning at age 50 and continuing until age 75. You may not need to do this test if you get a colonoscopy every 10 years.  Flexible sigmoidoscopy or colonoscopy.** / Every 5 years for a flexible sigmoidoscopy or every 10 years for a colonoscopy beginning at age 50 and continuing until age 75.  Hepatitis C blood test.** / For all people born from 1945 through 1965 and any individual with known risks for hepatitis C.  Osteoporosis screening.** / A one-time screening for women ages 65 and over and women at risk for fractures or osteoporosis.  Skin self-exam. / Monthly.  Influenza immunization.** / Every year.  Pneumococcal polysaccharide immunization.** / 1 dose at age 65 (or older) if you have never been vaccinated.  Tetanus, diphtheria, pertussis (Tdap, Td) immunization. / A one-time dose of Tdap vaccine if you are over   65 and have contact with an infant, are a healthcare worker, or simply want to be protected from whooping cough. After that, you need a Td booster dose every 10 years.  Varicella immunization.** / Consult your caregiver.  Meningococcal immunization.** / Consult your caregiver.  Hepatitis A immunization.** / Consult your caregiver. 2 doses, 6 to 18 months apart.  Hepatitis B immunization.** / Check with your caregiver. 3 doses, usually over 6 months. ** Family history and personal history of risk and conditions may change your caregiver's recommendations. Document Released: 10/19/2001 Document Revised: 11/15/2011  Document Reviewed: 01/18/2011 [employer] Patient Information 2014 [employer].  

## 2013-02-07 NOTE — Progress Notes (Signed)
Subjective:    Patient ID: Kaitlyn Avila, female    DOB: 1961-08-21, 52 y.o.   MRN: [id number]  HPI  [name] is here today for her annual CPE and to discuss the conditions listed below:   1)  Hypothyroidsm:  She is doing well on her Syntroid 125 mcg.  She needs a refill on it.   2)  Obesity:  She has been controlling her appetite with Phendimetrazine and needs a refill on it.   3)  Hypertension:  She has done well with her Triam/HCTZ and needs a refill on it.    Review of Systems  Constitutional: Negative for activity change, appetite change, fatigue and unexpected weight change.  HENT: Negative for hearing loss, ear pain, congestion, trouble swallowing, neck pain, dental problem and voice change.   Eyes: Negative for pain, redness and visual disturbance.  Respiratory: Negative for cough and shortness of breath.   Cardiovascular: Negative for chest pain, palpitations and leg swelling.  Gastrointestinal: Negative for nausea, vomiting, abdominal pain, diarrhea, constipation and blood in stool.  Endocrine: Negative for cold intolerance, heat intolerance, polydipsia, polyphagia and polyuria.  Genitourinary: Negative for dysuria, urgency, frequency, hematuria, vaginal discharge and pelvic pain.  Musculoskeletal: Negative for myalgias, back pain, joint swelling and arthralgias.  Skin: Negative for rash.  Neurological: Negative for dizziness, weakness and headaches.  Hematological: Negative for adenopathy. Does not bruise/bleed easily.  Psychiatric/Behavioral: Negative for sleep disturbance, dysphoric mood and decreased concentration. The patient is not nervous/anxious.     Past Medical History  Diagnosis Date  . Hypertension   . Hyperlipidemia   . PMS (premenstrual syndrome)   . Hypothyroidism    Family History  Problem Relation Age of Onset  . Asthma Mother   . Asthma Son   . Diabetes Maternal Grandmother     Type II   History   Social History Narrative   Marital Status:  Married Freight forwarder)    Children:  Sons (2)    Pets:  Dog (1)    Living Situation: Lives with husband and sons.     Occupation: Public affairs consultant) Psychologist, prison and probation services    Education: Engineer, agricultural    Tobacco: Never smoker   Alcohol Use:  None   Drug Use:  None   Diet:  Regular   Exercise:  Walking (3-5 X per week)     Hobbies:Scrapbooking                Objective:   Physical Exam  Constitutional: She is oriented to person, place, and time. She appears well-developed and well-nourished.  HENT:  Head: Normocephalic and atraumatic.  Right Ear: External ear normal.  Left Ear: External ear normal.  Nose: Nose normal.  Mouth/Throat: Oropharynx is clear and moist.  Eyes: Conjunctivae and EOM are normal. Pupils are equal, round, and reactive to light. No scleral icterus.  Neck: Normal range of motion. Neck supple. No thyromegaly present.  Cardiovascular: Normal rate, regular rhythm, normal heart sounds and intact distal pulses.  Exam reveals no gallop and no friction rub.   No murmur heard. Pulmonary/Chest: Effort normal and breath sounds normal. Right breast exhibits no inverted nipple, no mass, no nipple discharge, no skin change and no tenderness. Left breast exhibits no inverted nipple, no mass, no nipple discharge, no skin change and no tenderness. Breasts are symmetrical.  Abdominal: Soft. Bowel sounds are normal. Hernia confirmed negative in the right inguinal area and confirmed negative in the left inguinal area.  Genitourinary: Pelvic exam was performed  with patient supine. There is no rash, tenderness or lesion on the right labia. There is no rash, tenderness or lesion on the left labia.  Musculoskeletal: Normal range of motion. She exhibits no edema and no tenderness.  Lymphadenopathy:    She has no cervical adenopathy.       Right: No inguinal adenopathy present.       Left: No inguinal adenopathy present.  Neurological: She is alert and oriented to person, place, and time.  She has normal reflexes.  Skin: Skin is warm and dry.  Psychiatric: She has a normal mood and affect. Her behavior is normal. Judgment and thought content normal.       Assessment & Plan:    TIME 45 MINUTES:  MORE THAN 50% OF TIME WAS INVOLVED IN COUNSELING

## 2013-02-08 LAB — T4, FREE: Free T4: 1.93 ng/dL — ABNORMAL HIGH (ref 0.80–1.80)

## 2013-02-08 LAB — T3, FREE: T3, Free: 3.1 pg/mL (ref 2.3–4.2)

## 2013-02-08 LAB — TSH: TSH: 0.123 u[IU]/mL — ABNORMAL LOW (ref 0.350–4.500)

## 2013-02-11 ENCOUNTER — Encounter: Payer: Self-pay | Admitting: Family Medicine

## 2013-02-11 DIAGNOSIS — Z0000 Encounter for general adult medical examination without abnormal findings: Secondary | ICD-10-CM | POA: Insufficient documentation

## 2013-02-11 DIAGNOSIS — R5381 Other malaise: Secondary | ICD-10-CM

## 2013-02-11 DIAGNOSIS — I10 Essential (primary) hypertension: Secondary | ICD-10-CM | POA: Insufficient documentation

## 2013-02-11 DIAGNOSIS — E785 Hyperlipidemia, unspecified: Secondary | ICD-10-CM | POA: Insufficient documentation

## 2013-02-11 DIAGNOSIS — E669 Obesity, unspecified: Secondary | ICD-10-CM

## 2013-02-11 HISTORY — DX: Essential (primary) hypertension: I10

## 2013-02-11 HISTORY — DX: Hyperlipidemia, unspecified: E78.5

## 2013-02-11 HISTORY — DX: Other malaise: R53.81

## 2013-02-11 HISTORY — DX: Encounter for general adult medical examination without abnormal findings: Z00.00

## 2013-02-11 HISTORY — DX: Obesity, unspecified: E66.9

## 2013-02-11 NOTE — Assessment & Plan Note (Signed)
Normal Exam.

## 2013-02-11 NOTE — Assessment & Plan Note (Signed)
Refilled her phendimetrazine.

## 2013-02-11 NOTE — Assessment & Plan Note (Signed)
Checking a lipid panel.  

## 2013-02-11 NOTE — Assessment & Plan Note (Signed)
Refilled her Maxzide.  

## 2013-02-11 NOTE — Assessment & Plan Note (Signed)
Checking her thyroid panel.

## 2013-02-12 ENCOUNTER — Other Ambulatory Visit: Payer: Self-pay | Admitting: Family Medicine

## 2013-02-12 DIAGNOSIS — E039 Hypothyroidism, unspecified: Secondary | ICD-10-CM

## 2013-02-12 MED ORDER — LEVOTHYROXINE SODIUM 112 MCG PO TABS: 112.0000 ug | ORAL_TABLET | Freq: Every day | ORAL | Status: DC

## 2013-04-24 ENCOUNTER — Other Ambulatory Visit: Payer: Self-pay | Admitting: Physician Assistant

## 2013-08-28 ENCOUNTER — Ambulatory Visit (INDEPENDENT_AMBULATORY_CARE_PROVIDER_SITE_OTHER): Payer: BC Managed Care – PPO | Admitting: Family Medicine

## 2013-08-28 ENCOUNTER — Encounter: Payer: Self-pay | Admitting: Family Medicine

## 2013-08-28 VITALS — BP 130/77 | HR 83 | Resp 16 | Wt 199.0 lb

## 2013-08-28 DIAGNOSIS — E039 Hypothyroidism, unspecified: Secondary | ICD-10-CM

## 2013-08-28 DIAGNOSIS — E669 Obesity, unspecified: Secondary | ICD-10-CM

## 2013-08-28 NOTE — Progress Notes (Signed)
   Subjective:    Patient ID: Kaitlyn Avila, female    DOB: 1961/03/06, 52 y.o.   MRN: [id number]  HPI  Kaitlyn Avila is here today to discuss a couple of issues.  She is wanting to start back on the Step [employer] Step Program. She is also concerned about her thyroid levels. She says that ever since her levothyroxine was lowered, she has noticed that her hair has started falling out and her nails are brittle.     Review of Systems  Constitutional: Positive for fatigue and unexpected weight change.  HENT:       Hair loss   Skin:       Nails are brittle  All other systems reviewed and are negative.       Objective:   Physical Exam  Constitutional: She appears well-nourished. No distress.  HENT:  Head: Normocephalic.  Eyes: No scleral icterus.  Neck: No thyromegaly present.  Cardiovascular: Normal rate, regular rhythm and normal heart sounds.   Pulmonary/Chest: Effort normal and breath sounds normal.  Musculoskeletal: She exhibits no edema and no tenderness.  Neurological: She is alert.  Skin: Skin is warm and dry.  Psychiatric: She has a normal mood and affect. Her behavior is normal. Judgment and thought content normal.     Assessment & Plan:   Kaitlyn Avila was seen today for medication management and obesity.  Diagnoses and associated orders for this visit:  Unspecified hypothyroidism Comments: She is going to increase her levothyroxine back to 125 mcg. We'll recheck her level in 4-6 weeks.    Obesity, unspecified Comments: She is going to do a round of the HCG diet.

## 2013-09-26 ENCOUNTER — Other Ambulatory Visit: Payer: Self-pay | Admitting: *Deleted

## 2013-09-26 DIAGNOSIS — E039 Hypothyroidism, unspecified: Secondary | ICD-10-CM

## 2013-09-26 MED ORDER — LEVOTHYROXINE SODIUM 125 MCG PO TABS: 125.0000 ug | ORAL_TABLET | Freq: Every day | ORAL | Status: DC

## 2013-09-26 NOTE — Telephone Encounter (Signed)
Patient did not feel well on Levothyroxine 112 mcg.  She went back on her 125 mcg.  Refilled it for 30 days.  She has an appt on 10/08/13. PG

## 2013-09-28 ENCOUNTER — Ambulatory Visit (INDEPENDENT_AMBULATORY_CARE_PROVIDER_SITE_OTHER): Payer: BC Managed Care – PPO | Admitting: Family Medicine

## 2013-09-28 ENCOUNTER — Encounter: Payer: Self-pay | Admitting: Family Medicine

## 2013-09-28 VITALS — BP 122/82 | HR 82 | Resp 16 | Ht 62.5 in | Wt 186.0 lb

## 2013-09-28 DIAGNOSIS — E039 Hypothyroidism, unspecified: Secondary | ICD-10-CM

## 2013-09-28 DIAGNOSIS — E669 Obesity, unspecified: Secondary | ICD-10-CM

## 2013-09-28 MED ORDER — PHENDIMETRAZINE TARTRATE 35 MG PO TABS: 1.0000 | ORAL_TABLET | Freq: Three times a day (TID) | ORAL | Status: AC

## 2013-09-28 NOTE — Patient Instructions (Signed)
1)  HCG - Do another 14 days of 500 calories then move on to Phase III (1000 cal).  During Phase III do 1 shot  2x per week and 50 units of B12 weekly.  Once the HCG is gone then finish your B-12 by doing 1 syringe (100 units) monthly.

## 2013-09-28 NOTE — Progress Notes (Signed)
Subjective:    Patient ID: Kaitlyn Avila, female    DOB: Jun 21, 1961, 53 y.o.   MRN: [id number]  HPI  Viveca is here today to follow up on her weight and her thyroid.  She has lost 14 pounds since starting on the Step by Step program and she would like to continue another 10 days.  She says that she is feeling much better since increasing her levothyroxine to 112 mcg.  She has more energy and her hair has stopped falling out.      Review of Systems  Constitutional: Negative for fatigue and unexpected weight change.       She has lost 14 pounds on the Step By Step program  Psychiatric/Behavioral: Negative for sleep disturbance. The patient is not nervous/anxious.   All other systems reviewed and are negative.     Past Medical History  Diagnosis Date  . Hypertension   . Hyperlipidemia   . PMS (premenstrual syndrome)   . Hypothyroidism      Past Surgical History  Procedure Laterality Date  . Cholecystectomy    . Appendectomy    . Cesarean section    . Lumbar disc surgery       History   Social History Narrative   Marital Status: Married Journalist, newspaper)    Children:  Sons (2)    Pets:  Dog (1)    Living Situation: Lives with husband and sons.     Occupation: Curator) Therapist, sports    Education: Programmer, systems    Tobacco: Never smoker   Alcohol Use:  None   Drug Use:  None   Diet:  Regular   Exercise:  Walking (3-5 X per week)     Hobbies:Scrapbooking              Family History  Problem Relation Age of Onset  . Cancer Mother   . Leukemia Mother     died at 82  . Asthma Son   . Diabetes Maternal Grandmother     Type II  . Heart disease Father      Current Outpatient Prescriptions on File Prior to Visit  Medication Sig Dispense Refill  . levothyroxine (SYNTHROID, LEVOTHROID) 125 MCG tablet Take 1 tablet (125 mcg total) by mouth daily before breakfast.  30 tablet  0  . triamterene-hydrochlorothiazide (MAXZIDE-25) 37.5-25 MG per tablet Take 1  tablet by mouth daily.  90 tablet  3   No current facility-administered medications on file prior to visit.     Allergies  Allergen Reactions  . Prednisone      Immunization History  Administered Date(s) Administered  . Tdap 06/27/2009       Objective:   Physical Exam  Constitutional: She appears well-nourished. No distress.  HENT:  Head: Normocephalic.  Eyes: No scleral icterus.  Neck: No thyromegaly present.  Cardiovascular: Normal rate, regular rhythm and normal heart sounds.   Pulmonary/Chest: Effort normal and breath sounds normal.  Musculoskeletal: She exhibits no edema and no tenderness.  Neurological: She is alert.  Skin: Skin is warm and dry.  Psychiatric: She has a normal mood and affect. Her behavior is normal. Judgment and thought content normal.      Assessment & Plan:    Lashelle was seen today for weight check.  Diagnoses and associated orders for this visit:  Unspecified hypothyroidism  We checked Ritika's thyroid levels to see how her levels are on 112 mcg instead of 100 mcg.  Her TSH is low and  her Free T4 is high.  We'll have her take 1/2 tab one day and continue on the 112 mcg the other days.    - TSH - T4, free - T3, free  Obesity, unspecified - Phendimetrazine Tartrate 35 MG TABS; Take 1 tablet (35 mg total) by mouth 3 (three) times daily before meals.

## 2013-09-29 LAB — TSH: TSH: 0.106 u[IU]/mL — ABNORMAL LOW (ref 0.350–4.500)

## 2013-09-29 LAB — T4, FREE: Free T4: 1.9 ng/dL — ABNORMAL HIGH (ref 0.80–1.80)

## 2013-09-29 LAB — T3, FREE: T3, Free: 3.1 pg/mL (ref 2.3–4.2)

## 2013-10-05 ENCOUNTER — Other Ambulatory Visit: Payer: Self-pay | Admitting: *Deleted

## 2013-10-05 DIAGNOSIS — R5383 Other fatigue: Secondary | ICD-10-CM

## 2013-10-05 DIAGNOSIS — E785 Hyperlipidemia, unspecified: Secondary | ICD-10-CM

## 2013-10-05 DIAGNOSIS — R5381 Other malaise: Secondary | ICD-10-CM

## 2013-10-08 ENCOUNTER — Other Ambulatory Visit (INDEPENDENT_AMBULATORY_CARE_PROVIDER_SITE_OTHER): Payer: BC Managed Care – PPO

## 2013-10-18 ENCOUNTER — Ambulatory Visit: Payer: BC Managed Care – PPO | Admitting: Family Medicine

## 2013-11-27 ENCOUNTER — Other Ambulatory Visit: Payer: BC Managed Care – PPO

## 2013-11-27 LAB — LIPID PANEL
Cholesterol: 202 mg/dL — ABNORMAL HIGH (ref 0–200)
HDL: 49 mg/dL (ref >39)
LDL Cholesterol: 129 mg/dL — ABNORMAL HIGH (ref 0–99)
Total CHOL/HDL Ratio: 4.1 Ratio
Triglycerides: 119 mg/dL (ref <150)
VLDL: 24 mg/dL (ref 0–40)

## 2013-11-27 LAB — CBC WITH DIFFERENTIAL/PLATELET
Basophils Absolute: 0 10*3/uL (ref 0.0–0.1)
Basophils Relative: 1 % (ref 0–1)
Eosinophils Absolute: 0.1 10*3/uL (ref 0.0–0.7)
Eosinophils Relative: 2 % (ref 0–5)
HCT: 43.2 % (ref 36.0–46.0)
Hemoglobin: 15.1 g/dL — ABNORMAL HIGH (ref 12.0–15.0)
Lymphocytes Relative: 33 % (ref 12–46)
Lymphs Abs: 1.7 10*3/uL (ref 0.7–4.0)
MCH: 30.3 pg (ref 26.0–34.0)
MCHC: 35 g/dL (ref 30.0–36.0)
MCV: 86.7 fL (ref 78.0–100.0)
Monocytes Absolute: 0.3 10*3/uL (ref 0.1–1.0)
Monocytes Relative: 6 % (ref 3–12)
Neutro Abs: 3 10*3/uL (ref 1.7–7.7)
Neutrophils Relative %: 58 % (ref 43–77)
Platelets: 288 10*3/uL (ref 150–400)
RBC: 4.98 MIL/uL (ref 3.87–5.11)
RDW: 13.7 % (ref 11.5–15.5)
WBC: 5.2 10*3/uL (ref 4.0–10.5)

## 2013-11-27 LAB — COMPLETE METABOLIC PANEL WITH GFR
ALT: 16 U/L (ref 0–35)
AST: 19 U/L (ref 0–37)
Albumin: 4.4 g/dL (ref 3.5–5.2)
Alkaline Phosphatase: 88 U/L (ref 39–117)
BUN: 9 mg/dL (ref 6–23)
CO2: 30 mEq/L (ref 19–32)
Calcium: 9.7 mg/dL (ref 8.4–10.5)
Chloride: 100 mEq/L (ref 96–112)
Creat: 0.64 mg/dL (ref 0.50–1.10)
GFR, Est African American: >89 mL/min
GFR, Est Non African American: >89 mL/min
Glucose, Bld: 84 mg/dL (ref 70–99)
Potassium: 3.9 mEq/L (ref 3.5–5.3)
Sodium: 139 mEq/L (ref 135–145)
Total Bilirubin: 0.6 mg/dL (ref 0.2–1.2)
Total Protein: 7.1 g/dL (ref 6.0–8.3)

## 2014-02-14 ENCOUNTER — Telehealth: Payer: Self-pay

## 2014-02-14 ENCOUNTER — Other Ambulatory Visit: Payer: Self-pay | Admitting: *Deleted

## 2014-02-14 MED ORDER — TRIAMTERENE-HCTZ 37.5-25 MG PO TABS: 1.0000 | ORAL_TABLET | Freq: Every day | ORAL | Status: DC

## 2014-02-14 NOTE — Telephone Encounter (Signed)
Kaitlyn Avila called and I set her up with an appointment for 6.17.15 at 11:00am, she has about 4-5 of her Maxzide-25 left,so she needs it called in she has enough thyroid medicine to last. She would also like for her labs to be done at the same time she comes in, so that she does not have to take off work two different times.

## 2014-02-20 ENCOUNTER — Encounter: Payer: Self-pay | Admitting: Family Medicine

## 2014-02-20 ENCOUNTER — Ambulatory Visit (INDEPENDENT_AMBULATORY_CARE_PROVIDER_SITE_OTHER): Payer: BC Managed Care – PPO | Admitting: Family Medicine

## 2014-02-20 VITALS — BP 129/84 | HR 84 | Resp 16 | Ht 63.0 in | Wt 164.0 lb

## 2014-02-20 DIAGNOSIS — E038 Other specified hypothyroidism: Secondary | ICD-10-CM

## 2014-02-20 DIAGNOSIS — E663 Overweight: Secondary | ICD-10-CM

## 2014-02-20 DIAGNOSIS — I10 Essential (primary) hypertension: Secondary | ICD-10-CM

## 2014-02-20 MED ORDER — TRIAMTERENE-HCTZ 37.5-25 MG PO TABS: 1.0000 | ORAL_TABLET | Freq: Every day | ORAL | Status: DC

## 2014-02-20 MED ORDER — PHENDIMETRAZINE TARTRATE 35 MG PO TABS: 1.0000 | ORAL_TABLET | Freq: Three times a day (TID) | ORAL | Status: AC

## 2014-02-20 NOTE — Progress Notes (Signed)
   Subjective:    Patient ID: Kaitlyn Avila, female    DOB: 06-18-1961, 53 y.o.   MRN: [id number]  HPI  Kaitlyn Avila is here today needing to discuss the conditions listed below:   1)  Hypothyroidism:  She is doing well on the levothyroxine. She is needing a refill.  2)  Hypertension:  She is doing well on the Maxzide. She needs a refill.   3)  Obesity:  She has been working hard on her diet and exercise and has lost 22 lbs since January.     Review of Systems  Constitutional: Negative for activity change, appetite change, fatigue and unexpected weight change (she has been working hard on her diet and exercise).  Cardiovascular: Negative for chest pain, palpitations and leg swelling.  Endocrine: Negative for cold intolerance and heat intolerance.  All other systems reviewed and are negative.    Past Medical History  Diagnosis Date  . Hypertension   . Hyperlipidemia   . PMS (premenstrual syndrome)   . Hypothyroidism      Past Surgical History  Procedure Laterality Date  . Cholecystectomy    . Appendectomy    . Cesarean section    . Lumbar disc surgery       History   Social History Narrative   Marital Status: Married Journalist, newspaper)    Children:  Sons (2)    Pets:  Dog (1)    Living Situation: Lives with husband and sons.     Occupation: Curator) Therapist, sports    Education: Programmer, systems    Tobacco: Never smoker   Alcohol Use:  None   Drug Use:  None   Diet:  Regular   Exercise:  Walking (3-5 X per week)     Hobbies:Scrapbooking              Family History  Problem Relation Age of Onset  . Cancer Mother   . Leukemia Mother     died at 30  . Asthma Son   . Diabetes Maternal Grandmother     Type II  . Heart disease Father       Allergies  Allergen Reactions  . Prednisone      Immunization History  Administered Date(s) Administered  . Tdap 06/27/2009        Objective:   Physical Exam  Constitutional: She appears well-nourished.  No distress.  HENT:  Head: Normocephalic.  Eyes: No scleral icterus.  Neck: No thyromegaly present.  Cardiovascular: Normal rate, regular rhythm and normal heart sounds.   Pulmonary/Chest: Effort normal and breath sounds normal.  Musculoskeletal: She exhibits no edema and no tenderness.  Neurological: She is alert.  Skin: Skin is warm and dry.  Psychiatric: She has a normal mood and affect. Her behavior is normal. Judgment and thought content normal.      Assessment & Plan:    Kaitlyn Avila was seen today for medication management.  Diagnoses and associated orders for this visit:  Essential hypertension, benign - triamterene-hydrochlorothiazide (MAXZIDE-25) 37.5-25 MG per tablet; Take 1 tablet by mouth daily.  Other specified acquired hypothyroidism - TSH - T4, free - levothyroxine (SYNTHROID, LEVOTHROID) 112 MCG tablet; Take 1 tablet (112 mcg total) by mouth daily before breakfast.  Overweight(278.02) - Phendimetrazine Tartrate 35 MG TABS; Take 1 tablet (35 mg total) by mouth 3 (three) times daily before meals.    TIME SPENT "FACE TO FACE" WITH PATIENT -  30 MINS

## 2014-02-21 LAB — T4, FREE: Free T4: 1.44 ng/dL (ref 0.80–1.80)

## 2014-02-21 LAB — TSH: TSH: 0.403 u[IU]/mL (ref 0.350–4.500)

## 2014-03-29 ENCOUNTER — Telehealth: Payer: Self-pay

## 2014-03-29 ENCOUNTER — Other Ambulatory Visit: Payer: Self-pay | Admitting: *Deleted

## 2014-03-29 NOTE — Telephone Encounter (Signed)
Kaitlyn Avila needs to refills on her levothyroxine (SYNTHROID, LEVOTHROID) 112 MCG tablet. Will you send in please?

## 2014-04-02 ENCOUNTER — Other Ambulatory Visit: Payer: Self-pay | Admitting: *Deleted

## 2014-04-02 MED ORDER — LEVOTHYROXINE SODIUM 112 MCG PO TABS: 112.0000 ug | ORAL_TABLET | Freq: Every day | ORAL | Status: AC

## 2018-09-11 ENCOUNTER — Other Ambulatory Visit: Payer: Self-pay | Admitting: Obstetrics and Gynecology

## 2018-09-11 DIAGNOSIS — R928 Other abnormal and inconclusive findings on diagnostic imaging of breast: Secondary | ICD-10-CM

## 2018-09-12 ENCOUNTER — Ambulatory Visit
Admission: RE | Admit: 2018-09-12 | Discharge: 2018-09-12 | Disposition: A | Discharge status: Home / Self Care | Payer: Commercial Managed Care - PPO | Source: Ambulatory Visit | Attending: Obstetrics and Gynecology | Admitting: Obstetrics and Gynecology

## 2018-09-12 ENCOUNTER — Other Ambulatory Visit: Payer: Self-pay | Admitting: Obstetrics and Gynecology

## 2018-09-12 DIAGNOSIS — R928 Other abnormal and inconclusive findings on diagnostic imaging of breast: Secondary | ICD-10-CM

## 2018-09-12 DIAGNOSIS — N6002 Solitary cyst of left breast: Secondary | ICD-10-CM

## 2018-10-03 ENCOUNTER — Telehealth: Payer: Self-pay | Admitting: *Deleted

## 2018-10-03 NOTE — Telephone Encounter (Signed)
REFERRAL SENT TO SCHEDULING AND NOTES ON FILE FROM [place] DR. LORI CORDIAL [phone].

## 2018-11-03 NOTE — Progress Notes (Signed)
Cardiology Office Note   Date:  11/08/2018   ID:  Kaitlyn Avila, DOB 1961-06-30, MRN [id number]  PCP:  Evelene Croon, MD  Cardiologist:   Jenkins Rouge, MD   No chief complaint on file.     History of Present Illness: Kaitlyn Avila is a 58 y.o. female who presents for consultation regarding new murmur Referred by Ival Bible MD.  She has a history of HLD, HTN and hypothyroidism. She has used Phendimetrazine for weight loss in past. Denies history of rheumatic fever or SBE. She has been seen by bariatric surgery at [facility]   She indicates taking Fen-Phen 3-4 years ago. Has been told of murmur long time ago Has never had TTE. No cardiac symptoms Gets a bit dysthymic in winter  Sons Bryson and Tommi Rumps played ball with my son Olevia Perches in [place] Her Husband Charlotte Crumb is also a patient of mine    Past Medical History:  Diagnosis Date  . Abnormal weight gain 11/30/2007  . DEPRESSION 11/30/2007   Qualifier: Diagnosis of  By: Loanne Drilling MD, Jacelyn Pi   . DEPRESSION 11/30/2007   Qualifier: Diagnosis of  By: Loanne Drilling MD, Sean A   . Essential hypertension, benign 02/11/2013  . Hyperlipidemia   . Hypertension   . Hypothyroidism   . Obesity, unspecified 02/11/2013  . Other and unspecified hyperlipidemia 02/11/2013  . Other malaise and fatigue 02/11/2013  . PMS (premenstrual syndrome)   . Routine general medical examination at a health care facility 02/11/2013    Past Surgical History:  Procedure Laterality Date  . APPENDECTOMY    . CESAREAN SECTION    . CHOLECYSTECTOMY    . LUMBAR DISC SURGERY       Current Outpatient Medications  Medication Sig Dispense Refill  . estradiol-norethindrone (ACTIVELLA) 1-0.5 MG tablet Take 1 tablet by mouth daily.    Marland Kitchen FLUoxetine (PROZAC) 20 MG capsule Take 1 capsule by mouth daily.    . Levothyroxine Sodium 112 MCG CAPS Take 1 tablet by mouth daily.    Marland Kitchen triamterene-hydrochlorothiazide (MAXZIDE-25) 37.5-25 MG per tablet Take 1 tablet by mouth daily. 90  tablet 3   No current facility-administered medications for this visit.     Allergies:   Prednisone    Social History:  The patient  reports that she has never smoked. She has never used smokeless tobacco. She reports that she does not drink alcohol or use drugs.   Family History:  The patient's family history includes Asthma in her son; Cancer in her mother; Diabetes in her maternal grandmother; Heart disease in her father; Leukemia in her mother.    ROS:  Please see the history of present illness.   Otherwise, review of systems are positive for none.   All other systems are reviewed and negative.    PHYSICAL EXAM: VS:  BP 140/88   Pulse 71   Ht 5\' 3"  (1.6 m)   Wt 94.8 kg   SpO2 99%   BMI 37.02 kg/m  , BMI Body mass index is 37.02 kg/m. Affect appropriate Healthy:  appears stated age 47: normal Neck supple with no adenopathy JVP normal no bruits no thyromegaly Lungs clear with no wheezing and good diaphragmatic motion Heart:  S1/S2 no murmur, no rub, gallop or click PMI normal Abdomen: benighn, BS positve, no tenderness, no AAA no bruit.  No HSM or HJR Distal pulses intact with no bruits No edema Neuro non-focal Skin warm and dry No muscular weakness    EKG:  SR rate 71 possible LAE otherwise normal 11/08/18    Recent Labs: No results found for requested labs within last 8760 hours.    Lipid Panel    Component Value Date/Time   CHOL 202 (H) 11/27/2013 0825   TRIG 119 11/27/2013 0825   HDL 49 11/27/2013 0825   CHOLHDL 4.1 11/27/2013 0825   VLDL 24 11/27/2013 0825   LDLCALC 129 (H) 11/27/2013 0825   LDLDIRECT 164.7 04/08/2008 0000      Wt Readings from Last 3 Encounters:  11/08/18 94.8 kg  02/20/14 74.4 kg  09/28/13 84.4 kg      Other studies Reviewed: Additional studies/ records that were reviewed today include: Notes from primary and [facility].    ASSESSMENT AND PLAN:  1.  HTN:  Well controlled.  Continue current medications and  low sodium Dash type diet.   2.  HLD:  Diet controlled labs with primary 3. Thyroid:  Continue replacement labs with primary  4. Murmur:  AV murmur r/o bicuspid valve doubt severe AS no need for SBE    Current medicines are reviewed at length with the patient today.  The patient does not have concerns regarding medicines.  The following changes have been made:  no change  Labs/ tests ordered today include: TTE   Orders Placed This Encounter  Procedures  . EKG 12-Lead  . ECHOCARDIOGRAM COMPLETE     Disposition:   FU with cardiology in a year pending TTE results     Signed, Jenkins Rouge, MD  11/08/2018 8:38 AM    [facility] [address], [place], [place]  [address] Phone: [phone]; Fax: [phone]

## 2018-11-08 ENCOUNTER — Ambulatory Visit: Payer: Commercial Managed Care - PPO | Admitting: Cardiovascular Disease

## 2018-11-08 ENCOUNTER — Encounter (INDEPENDENT_AMBULATORY_CARE_PROVIDER_SITE_OTHER): Payer: Self-pay

## 2018-11-08 VITALS — BP 140/88 | HR 71 | Ht 63.0 in | Wt 209.0 lb

## 2018-11-08 DIAGNOSIS — I10 Essential (primary) hypertension: Secondary | ICD-10-CM

## 2018-11-08 DIAGNOSIS — R011 Cardiac murmur, unspecified: Secondary | ICD-10-CM

## 2018-11-08 DIAGNOSIS — E785 Hyperlipidemia, unspecified: Secondary | ICD-10-CM | POA: Diagnosis not present

## 2018-11-08 NOTE — Patient Instructions (Addendum)
Medication Instructions:   If you need a refill on your cardiac medications before your next appointment, please call your pharmacy.   Lab work:  If you have labs (blood work) drawn today and your tests are completely normal, you will receive your results only by: Marland Kitchen MyChart Message (if you have MyChart) OR . A paper copy in the mail If you have any lab test that is abnormal or we need to change your treatment, we will call you to review the results.  Testing/Procedures: Your physician has requested that you have an echocardiogram. Echocardiography is a painless test that uses sound waves to create images of your heart. It provides your doctor with information about the size and shape of your heart and how well your heart's chambers and valves are working. This procedure takes approximately one hour. There are no restrictions for this procedure.  Follow-Up: At [facility], you and your health needs are our priority.  As part of our continuing mission to provide you with exceptional heart care, we have created designated Provider Care Teams.  These Care Teams include your primary Cardiologist (physician) and Advanced Practice Providers (APPs -  Physician Assistants and Nurse Practitioners) who all work together to provide you with the care you need, when you need it. You will need a follow up appointment in 12 months.  Please call our office 2 months in advance to schedule this appointment.  You may see Dr. Johnsie Cancel or one of the following Advanced Practice Providers on your designated Care Team:   Truitt Merle, NP Cecilie Kicks, NP . Kathyrn Drown, NP

## 2018-11-15 ENCOUNTER — Ambulatory Visit (HOSPITAL_COMMUNITY): Payer: Commercial Managed Care - PPO | Attending: Cardiovascular Disease

## 2018-11-15 ENCOUNTER — Other Ambulatory Visit: Payer: Self-pay

## 2018-11-15 DIAGNOSIS — E785 Hyperlipidemia, unspecified: Secondary | ICD-10-CM | POA: Diagnosis not present

## 2018-11-15 DIAGNOSIS — I10 Essential (primary) hypertension: Secondary | ICD-10-CM | POA: Diagnosis present

## 2018-11-15 DIAGNOSIS — R011 Cardiac murmur, unspecified: Secondary | ICD-10-CM | POA: Insufficient documentation

## 2018-11-29 ENCOUNTER — Ambulatory Visit: Payer: Commercial Managed Care - PPO | Admitting: Cardiology

## 2019-02-24 ENCOUNTER — Encounter (HOSPITAL_COMMUNITY): Payer: Self-pay

## 2019-02-24 ENCOUNTER — Emergency Department (HOSPITAL_BASED_OUTPATIENT_CLINIC_OR_DEPARTMENT_OTHER): Payer: Commercial Managed Care - PPO

## 2019-02-24 ENCOUNTER — Other Ambulatory Visit: Payer: Self-pay

## 2019-02-24 ENCOUNTER — Emergency Department (HOSPITAL_COMMUNITY): Payer: Commercial Managed Care - PPO

## 2019-02-24 ENCOUNTER — Emergency Department (HOSPITAL_COMMUNITY)
Admission: EM | Admit: 2019-02-24 | Discharge: 2019-02-24 | Disposition: A | Discharge status: Home / Self Care | Payer: Commercial Managed Care - PPO | Attending: Emergency Medicine | Admitting: Emergency Medicine

## 2019-02-24 DIAGNOSIS — M79604 Pain in right leg: Secondary | ICD-10-CM | POA: Diagnosis present

## 2019-02-24 DIAGNOSIS — Z79899 Other long term (current) drug therapy: Secondary | ICD-10-CM | POA: Diagnosis not present

## 2019-02-24 DIAGNOSIS — L538 Other specified erythematous conditions: Secondary | ICD-10-CM

## 2019-02-24 DIAGNOSIS — I82811 Embolism and thrombosis of superficial veins of right lower extremities: Secondary | ICD-10-CM | POA: Insufficient documentation

## 2019-02-24 DIAGNOSIS — I10 Essential (primary) hypertension: Secondary | ICD-10-CM | POA: Diagnosis not present

## 2019-02-24 DIAGNOSIS — M7989 Other specified soft tissue disorders: Secondary | ICD-10-CM

## 2019-02-24 DIAGNOSIS — R52 Pain, unspecified: Secondary | ICD-10-CM | POA: Diagnosis not present

## 2019-02-24 DIAGNOSIS — E039 Hypothyroidism, unspecified: Secondary | ICD-10-CM | POA: Diagnosis not present

## 2019-02-24 LAB — BASIC METABOLIC PANEL
Anion gap: 11 (ref 5–15)
BUN: 18 mg/dL (ref 6–20)
CO2: 23 mmol/L (ref 22–32)
Calcium: 9.1 mg/dL (ref 8.9–10.3)
Chloride: 105 mmol/L (ref 98–111)
Creatinine, Ser: 0.7 mg/dL (ref 0.44–1.00)
GFR calc Af Amer: >60 mL/min (ref >60)
GFR calc non Af Amer: >60 mL/min (ref >60)
Glucose, Bld: 103 mg/dL — ABNORMAL HIGH (ref 70–99)
Potassium: 4.1 mmol/L (ref 3.5–5.1)
Sodium: 139 mmol/L (ref 135–145)

## 2019-02-24 LAB — TROPONIN I
Troponin I: <0.03 ng/mL (ref <0.03)
Troponin I: <0.03 ng/mL (ref <0.03)

## 2019-02-24 LAB — CBC
HCT: 49.3 % — ABNORMAL HIGH (ref 36.0–46.0)
Hemoglobin: 15.7 g/dL — ABNORMAL HIGH (ref 12.0–15.0)
MCH: 29.1 pg (ref 26.0–34.0)
MCHC: 31.8 g/dL (ref 30.0–36.0)
MCV: 91.5 fL (ref 80.0–100.0)
Platelets: 275 10*3/uL (ref 150–400)
RBC: 5.39 MIL/uL — ABNORMAL HIGH (ref 3.87–5.11)
RDW: 14.1 % (ref 11.5–15.5)
WBC: 7 10*3/uL (ref 4.0–10.5)
nRBC: 0 % (ref 0.0–0.2)

## 2019-02-24 MED ORDER — RIVAROXABAN (XARELTO) VTE STARTER PACK (15 & 20 MG): ORAL_TABLET | ORAL | 0 refills | Status: DC

## 2019-02-24 MED ORDER — OXYCODONE-ACETAMINOPHEN 5-325 MG PO TABS
1.0000 | ORAL_TABLET | Freq: Once | ORAL | Status: AC
  Administered 2019-02-24: 1 via ORAL
  Filled 2019-02-24: qty 1

## 2019-02-24 MED ORDER — RIVAROXABAN 15 MG PO TABS: 15.0000 mg | ORAL_TABLET | Freq: Once | ORAL | Status: DC

## 2019-02-24 MED ORDER — OXYCODONE-ACETAMINOPHEN 5-325 MG PO TABS: 1.0000 | ORAL_TABLET | Freq: Four times a day (QID) | ORAL | 0 refills | Status: DC | PRN

## 2019-02-24 NOTE — Progress Notes (Signed)
Right lower extremity venous duplex completed. Preliminary results in Chart review CV Proc. 02/24/2019, 12:20 PM

## 2019-02-24 NOTE — ED Provider Notes (Signed)
[place] DEPT Provider Note   CSN: [id number] Arrival date & time: 02/24/19  1019     History   Chief Complaint Chief Complaint  Patient presents with  . Leg Pain    HPI Kaitlyn Avila is a 58 y.o. female with a hx of HTN, hyperlipidemia, hypothyroidism, & depression who presents to the ED from urgent care for evaluation of RLE pain x 3 days. Patient states pain began as a sharp/stinging pain to the medial calf, but has since progressed to the entire calf & the knee. The pain is a 7/10 in severity currently, worse with weightbearing on the RLE, no alleviating factors, no meds tried PTA. She feels the R foot is a bit cooler to the touch than the left. Seen at [facility] & sent to the ED for concern for DVT. Denies trauma or change in activity. Denies fever, chills, or color change. Denies numbness, tingling, or weakness. Denies chest pain, dyspnea, hemoptysis, recent surgery/trauma, recent long travel, personal hx of cancer, or hx of DVT/PE. She does take mimvey for menopause.      HPI  Past Medical History:  Diagnosis Date  . Abnormal weight gain 11/30/2007  . DEPRESSION 11/30/2007   Qualifier: Diagnosis of  By: Loanne Drilling MD, Jacelyn Pi   . DEPRESSION 11/30/2007   Qualifier: Diagnosis of  By: Loanne Drilling MD, Sean A   . Essential hypertension, benign 02/11/2013  . Hyperlipidemia   . Hypertension   . Hypothyroidism   . Obesity, unspecified 02/11/2013  . Other and unspecified hyperlipidemia 02/11/2013  . Other malaise and fatigue 02/11/2013  . PMS (premenstrual syndrome)   . Routine general medical examination at a health care facility 02/11/2013    Patient Active Problem List   Diagnosis Date Noted  . Routine general medical examination at a health care facility 02/11/2013  . Other and unspecified hyperlipidemia 02/11/2013  . Obesity, unspecified 02/11/2013  . Other malaise and fatigue 02/11/2013  . Essential hypertension, benign 02/11/2013  . HYPOTHYROIDISM 11/30/2007   . DEPRESSION 11/30/2007    Past Surgical History:  Procedure Laterality Date  . APPENDECTOMY    . CESAREAN SECTION    . CHOLECYSTECTOMY    . LUMBAR DISC SURGERY       OB History   No obstetric history on file.      Home Medications    Prior to Admission medications   Medication Sig Start Date End Date Taking? Authorizing Provider  estradiol-norethindrone (ACTIVELLA) 1-0.5 MG tablet Take 1 tablet by mouth daily. 11/10/17   [provider]  FLUoxetine (PROZAC) 20 MG capsule Take 1 capsule by mouth daily. 09/28/18   [provider]  Levothyroxine Sodium 112 MCG CAPS Take 1 tablet by mouth daily.    [provider]  triamterene-hydrochlorothiazide (MAXZIDE-25) 37.5-25 MG per tablet Take 1 tablet by mouth daily. 02/20/14 02/20/15  Jonathon Resides, MD    Family History Family History  Problem Relation Age of Onset  . Cancer Mother   . Leukemia Mother        died at 17  . Asthma Son   . Diabetes Maternal Grandmother        Type II  . Heart disease Father     Social History Social History   Tobacco Use  . Smoking status: Never Smoker  . Smokeless tobacco: Never Used  Substance Use Topics  . Alcohol use: No  . Drug use: No     Allergies   Prednisone   Review  of Systems Review of Systems  Constitutional: Negative for chills and fever.  Respiratory: Negative for cough and shortness of breath.   Cardiovascular: Positive for leg swelling. Negative for chest pain.  Musculoskeletal: Positive for arthralgias and myalgias.  Skin: Negative for color change and wound.  Neurological: Negative for weakness and numbness.  All other systems reviewed and are negative.    Physical Exam Updated Vital Signs BP (!) 157/85 (BP Location: Left Arm)   Pulse 77   Temp 97.6 F (36.4 C) (Oral)   Resp 18   LMP  (LMP Unknown)   SpO2 100%   Physical Exam Vitals signs and nursing note reviewed.  Constitutional:      General: She is not in acute  distress.    Appearance: She is well-developed. She is not ill-appearing or toxic-appearing.  HENT:     Head: Normocephalic and atraumatic.  Eyes:     General:        Right eye: No discharge.        Left eye: No discharge.     Conjunctiva/sclera: Conjunctivae normal.  Neck:     Musculoskeletal: Neck supple.  Cardiovascular:     Rate and Rhythm: Normal rate and regular rhythm.     Pulses:          Dorsalis pedis pulses are 2+ on the right side and 2+ on the left side.       Posterior tibial pulses are 2+ on the right side and 2+ on the left side.  Pulmonary:     Effort: Pulmonary effort is normal. No respiratory distress.     Breath sounds: Normal breath sounds. No wheezing, rhonchi or rales.  Abdominal:     General: There is no distension.     Palpations: Abdomen is soft.     Tenderness: There is no abdominal tenderness.  Musculoskeletal:     Comments: Lower extremities: Patient has trace edema that is symmetric to the lower legs. Varicose veins noted throughout. . No obvious deformity, erythema, ecchymosis, warmth, or open wounds. R foot is mildly cooler to the touch compared to the L, however symmetric pulses & < 2 second cap refill to all digits. Patient has intact AROM to bilateral hips, knees, ankles, and all digits. Tender to palpation to the R calf & the diffuse knee including medial/lateral joint line of R knee.   Skin:    General: Skin is warm and dry.     Capillary Refill: Capillary refill takes less than 2 seconds.     Findings: No rash.  Neurological:     Mental Status: She is alert.     Comments: Alert. Clear speech. Sensation grossly intact to bilateral lower extremities. 5/5 strength with plantar/dorsiflexion bilaterally. Antalgic gait noted  Psychiatric:        Mood and Affect: Mood normal.        Behavior: Behavior normal.    ED Treatments / Results  Labs (all labs ordered are listed, but only abnormal results are displayed) Labs Reviewed  CBC - Abnormal;  Notable for the following components:      Result Value   RBC 5.39 (*)    Hemoglobin 15.7 (*)    HCT 49.3 (*)    All other components within normal limits  BASIC METABOLIC PANEL - Abnormal; Notable for the following components:   Glucose, Bld 103 (*)    All other components within normal limits  TROPONIN I  TROPONIN I    EKG   EKG Interpretation  Date/Time:  Saturday February 24 2019 13:15:59 EDT Ventricular Rate:  82 PR Interval:    QRS Duration: 84 QT Interval:  394 QTC Calculation: 461 R Axis:   73 Text Interpretation:  Sinus rhythm Confirmed by Virgel Manifold [phone]) on 02/24/2019 1:43:07 PM   Radiology Dg Knee Complete 4 Views Right  Result Date: 02/24/2019 CLINICAL DATA:  Right mid and upper calf pain. EXAM: RIGHT KNEE - COMPLETE 4+ VIEW COMPARISON:  None. FINDINGS: No evidence of fracture, dislocation, or joint effusion. No evidence of arthropathy or other focal bone abnormality. Soft tissues are unremarkable. IMPRESSION: Negative. Electronically Signed   By: Fidela Salisbury M.D.   On: 02/24/2019 13:10   Vas [place] Lower Extremity Venous (dvt) (only Mc & Wl 7a-7p)  Result Date: 02/24/2019  Lower Venous Study Indications: Pain, Swelling, and Erythema.  Comparison Study: No previous exam available Performing Technologist: Toma Copier RVS  Examination Guidelines: A complete evaluation includes B-mode imaging, spectral Doppler, color Doppler, and power Doppler as needed of all accessible portions of each vessel. Bilateral testing is considered an integral part of a complete examination. Limited examinations for reoccurring indications may be performed as noted.  +---------+---------------+---------+-----------+----------+-------------------+ RIGHT    CompressibilityPhasicitySpontaneityPropertiesSummary             +---------+---------------+---------+-----------+----------+-------------------+ CFV      Full           Yes      Yes                                       +---------+---------------+---------+-----------+----------+-------------------+ SFJ      Full                                                             +---------+---------------+---------+-----------+----------+-------------------+ FV Prox  Full           Yes      Yes                                      +---------+---------------+---------+-----------+----------+-------------------+ FV Mid   Full                                                             +---------+---------------+---------+-----------+----------+-------------------+ FV DistalFull           Yes      Yes                                      +---------+---------------+---------+-----------+----------+-------------------+ PFV      Full                                                             +---------+---------------+---------+-----------+----------+-------------------+  POP      Full           Yes      Yes                                      +---------+---------------+---------+-----------+----------+-------------------+ PTV      Full                                                             +---------+---------------+---------+-----------+----------+-------------------+ PERO     Full                                                             +---------+---------------+---------+-----------+----------+-------------------+ GSV      Partial                                      Acute -                                                                   compressible in the                                                       thigh . Non                                                               compressible in the                                                       lower leg           +---------+---------------+---------+-----------+----------+-------------------+   +----+---------------+---------+-----------+----------+-------+  LEFTCompressibilityPhasicitySpontaneityPropertiesSummary +----+---------------+---------+-----------+----------+-------+ CFV Full           Yes      Yes                          +----+---------------+---------+-----------+----------+-------+ SFJ Full                                                 +----+---------------+---------+-----------+----------+-------+  Summary: Right: Findings consistent with acute superficial vein thrombosis involving the right great saphenous vein. There is no evidence of deep vein thrombosis in the lower extremity. No cystic structure found in the popliteal fossa. Left: There is no evidence of a common femoral vein obstruction.  *See table(s) above for measurements and observations.    Preliminary     Procedures Procedures (including critical care time)  Medications Ordered in ED Medications  oxyCODONE-acetaminophen (PERCOCET/ROXICET) 5-325 MG per tablet 1 tablet (1 tablet Oral Given 02/24/19 1047)    Initial Impression / Assessment and Plan / ED Course  I have reviewed the triage vital signs and the nursing notes.  Pertinent labs & imaging results that were available during my care of the patient were reviewed by me and considered in my medical decision making (see chart for details).   Patient presents to the ED w/ complaints of RLE pain x 3 days. Nontoxic appearing, no apparent distress, vitals w/ mildly elevated BP- doubt HTN emergency. No fever, erythema, or warmth- does not seem consistent w/ infectious process such as septic joint, cellulitis, or osteomyelitis. No trauma, R knee xray negative, R lower leg w/o point/focal bony tenderness, doubt fx/dislocation. Labs reviewed & fairly unremarkable- no anemia, leukocytosis, renal dysfunction, or electrolyte derangement. RLE venous duplex negative for DVT, findings consistent with acute superficial vein thrombosis involving the right great saphenous vein. Discusused w/ Dr. Wilson Singer- recommends  anticoagulation. Will start patient on xarelto, consult pharmacy for medication education which I have also performed.   13:18: Informed by nursing staff patient had an episode of epigastric pain w/ diaphoresis. I evaluated patient @ bedside she states she was at rest when she developed a pain in the epigastric area with lightheadedness, tingling to the fingers bilaterally, and diaphoresis. Lasted < 1 minute, feeling fairly back to baseline now. No hx of similar. No full syncope. EKG obtained- No STEMI.  Will order troponin.   EKG no STEMI. Delta trop negative. Doubt ACS. No re-occurrence of sxs. Pain was more epigastric & brief, patient is not hypoxic or tachycardic on my multiple re-evals, has a superficial venous thrombosis as opposed to deep venous thrombosis, do not suspect sxs related to PE as sxs resolved almost immediately & have not reoccurred. Symmetric pulses, doubt dissection. No abdominal tenderness/ peritoneal signs to suggest pancreatitis, cholecystitis, perf, or obstruction. Given she has not had reoccurrence of sxs feel she is appropriate for discharge home as initially planned. Start on xarelto, medication counseling has been performed including risks/benefits & strict return precautions regarding this medicine. Repeat exam remains w/ good pulses & good cap refill distal extremities now feel symmetric in terms of temperature. Percocet for pain. [place] Controlled Substance reporting System queried. She has been given vascular surgery information as she would like to follow up with them about her varicose veins. I discussed results, treatment plan, need for follow-up, and return precautions with the patient. Provided opportunity for questions, patient confirmed understanding and is in agreement with plan.   Findings and plan of care discussed with supervising physician Dr. Wilson Singer who is in agreement.   Final Clinical Impressions(s) / ED Diagnoses   Final diagnoses:  Acute  superficial venous thrombosis of lower extremity, right    ED Discharge Orders         Ordered    oxyCODONE-acetaminophen (PERCOCET/ROXICET) 5-325 MG tablet  Every 6 hours PRN     02/24/19 1612    Rivaroxaban 15 & 20 MG TBPK     02/24/19  [address] 02/24/19 1706    Virgel Manifold, MD 02/25/19 [phone]

## 2019-02-24 NOTE — Discharge Instructions (Addendum)
You were seen in the ER today for right lower extremity pain and found to have superficial thrombosis of the great saphenous vein in your leg. This is otherwise known as a clot to a superficial vein. We suspect this is causing your pain. Your xray of your knee was normal. Your labs did not show significant abnormality. Your hemoglobin was a bit elevated this can be rechecked by primary care.   We are sending you home with a prescription for xarelto and for percocet.  - Xarelto is a blood thinner- further information on this medicine is listed below. Do not take NSAIDs (ibuprofen, advil, aleve, naproxen, mobic, goodie powders etc.) with this medicine. If you have abnormal bleeding, dark stool/urine, hit your head, or have abnormal bruising return to the ER immediately for evaluation.  -Percocet-this is a narcotic/controlled substance medication that has potential addicting qualities.  We recommend that you take 1-2 tablets every 6 hours as needed for severe pain.  Do not drive or operate heavy machinery when taking this medicine as it can be sedating. Do not drink alcohol or take other sedating medications when taking this medicine for safety reasons.  Keep this out of reach of small children.  Please be aware this medicine has Tylenol in it (325 mg/tab) do not exceed the maximum dose of Tylenol in a day per over the counter recommendations should you decide to supplement with Tylenol over the counter.   We have prescribed you new medication(s) today. Discuss the medications prescribed today with your pharmacist as they can have adverse effects and interactions with your other medicines including over the counter and prescribed medications. Seek medical evaluation if you start to experience new or abnormal symptoms after taking one of these medicines, seek care immediately if you start to experience difficulty breathing, feeling of your throat closing, facial swelling, or rash as these could be indications of a  more serious allergic reaction  Follow up with primary care within 3 days. Return to the ER for new or worsening symptoms including but not  limited to worsened pain, numbness, weakness, color change to the leg, shortness of breath, chest pain, or any other concerns.    Information on my medicine - XARELTO (rivaroxaban)  This medication education was reviewed with me or my healthcare representative as part of my discharge preparation.  The pharmacist that spoke with me during my hospital stay was:  Biagio Borg, [place]? Xarelto was prescribed to treat blood clots that may have been found in the veins of your legs (deep vein thrombosis) or in your lungs (pulmonary embolism) and to reduce the risk of them occurring again.  WHAT DO YOU NEED TO KNOW ABOUT XARELTO? The starting dose is one 15 mg tablet taken TWICE daily with food for the FIRST 21 DAYS then on 03/17/19  the dose is changed to one 20 mg tablet taken ONCE A DAY with your evening meal.  DO NOT stop taking Xarelto without talking to the health care provider who prescribed the medication.  Refill your prescription for 20 mg tablets before you run out.  After discharge, you should have regular check-up appointments with your healthcare provider that is prescribing your Xarelto.  In the future your dose may need to be changed if your kidney function changes by a significant amount.  WHAT DO YOU DO IF YOU MISS A DOSE? If you are taking Xarelto TWICE DAILY and you miss a dose, take it as  soon as you remember. You may take two 15 mg tablets (total 30 mg) at the same time then resume your regularly scheduled 15 mg twice daily the next day.  If you are taking Xarelto ONCE DAILY and you miss a dose, take it as soon as you remember on the same day then continue your regularly scheduled once daily regimen the next day. Do not take two doses of Xarelto at the same time.   IMPORTANT SAFETY  INFORMATION Xarelto is a blood thinner medicine that can cause bleeding. You should call your healthcare provider right away if you experience any of the following: Bleeding from an injury or your nose that does not stop. Unusual colored urine (red or dark brown) or unusual colored stools (red or black). Unusual bruising for unknown reasons. A serious fall or if you hit your head (even if there is no bleeding).  Some medicines may interact with Xarelto and might increase your risk of bleeding while on Xarelto. To help avoid this, consult your healthcare provider or pharmacist prior to using any new prescription or non-prescription medications, including herbals, vitamins, non-steroidal anti-inflammatory drugs (NSAIDs) and supplements.  This website has more information on Xarelto: [email or web address]-benson.[email or web address]

## 2019-02-24 NOTE — ED Notes (Signed)
Patient began feeling diaphoretic and complaining of chest pain in the center of chest. PA Sam made aware.

## 2019-02-24 NOTE — ED Triage Notes (Signed)
She c/o atraumatic left mid and upper calf area pain since this Thurs., which is increasing in area and now includes post. Knee area. She denies any sign of current illness and is in no distress.

## 2019-02-24 NOTE — ED Notes (Signed)
Patient given discharge teaching and verbalized understanding. Patient taken out of the ED w/ a wheelchair.

## 2019-03-15 ENCOUNTER — Other Ambulatory Visit: Payer: Commercial Managed Care - PPO

## 2019-09-19 ENCOUNTER — Other Ambulatory Visit: Payer: Self-pay | Admitting: Obstetrics and Gynecology

## 2019-09-19 DIAGNOSIS — N6002 Solitary cyst of left breast: Secondary | ICD-10-CM

## 2019-10-03 ENCOUNTER — Ambulatory Visit
Admission: RE | Admit: 2019-10-03 | Discharge: 2019-10-03 | Disposition: A | Discharge status: Home / Self Care | Payer: Commercial Managed Care - PPO | Source: Ambulatory Visit | Attending: Obstetrics and Gynecology | Admitting: Obstetrics and Gynecology

## 2019-10-03 ENCOUNTER — Other Ambulatory Visit: Payer: Self-pay

## 2019-10-03 DIAGNOSIS — N6002 Solitary cyst of left breast: Secondary | ICD-10-CM

## 2019-10-09 ENCOUNTER — Ambulatory Visit: Payer: Commercial Managed Care - PPO | Admitting: Gastroenterology

## 2019-10-09 ENCOUNTER — Encounter: Payer: Self-pay | Admitting: Gastroenterology

## 2019-10-09 VITALS — BP 124/80 | HR 74 | Ht 62.5 in | Wt 204.0 lb

## 2019-10-09 DIAGNOSIS — R195 Other fecal abnormalities: Secondary | ICD-10-CM

## 2019-10-09 DIAGNOSIS — Z01818 Encounter for other preprocedural examination: Secondary | ICD-10-CM | POA: Diagnosis not present

## 2019-10-09 MED ORDER — NA SULFATE-K SULFATE-MG SULF 17.5-3.13-1.6 GM/177ML PO SOLN: 1.0000 | Freq: Once | ORAL | 0 refills | Status: AC

## 2019-10-09 NOTE — Progress Notes (Signed)
History of Present Illness: This is a 59 year old female referred by Cordial, Launa Grill, MD for the evaluation of FIT positive stool.  She has no gastrointestinal complaints.  No prior colonoscopy.  She relates a right lower extremity DVT in 2020 that was treated with anticoagulants for several months.  Her symptoms resolved.  Blood clots resolved and anticoagulation was discontinued in November 2020.  Denies weight loss, abdominal pain, constipation, diarrhea, change in stool caliber, melena, hematochezia, nausea, vomiting, dysphagia, reflux symptoms, chest pain.     Allergies  Allergen Reactions  . Iodinated Diagnostic Agents Itching  . Prednisone Anaphylaxis  . Erythromycin Base Rash   Outpatient Medications Prior to Visit  Medication Sig Dispense Refill  . BLACK ELDERBERRY PO Take 1 each by mouth daily.    Marland Kitchen FLUoxetine (PROZAC) 10 MG capsule Take 10 mg by mouth at bedtime.     Marland Kitchen levothyroxine (SYNTHROID) 112 MCG tablet Take 112 mcg by mouth daily.    . Multiple Vitamins-Minerals (MULTIVITAMIN GUMMIES WOMENS) CHEW Chew 1 each by mouth daily.    Marland Kitchen acetaminophen (TYLENOL) 500 MG tablet Take 1,000 mg by mouth every 6 (six) hours as needed for mild pain.    Marland Kitchen estradiol-norethindrone (ACTIVELLA) 1-0.5 MG tablet Take 1 tablet by mouth daily.    Marland Kitchen oxyCODONE-acetaminophen (PERCOCET/ROXICET) 5-325 MG tablet Take 1-2 tablets by mouth every 6 (six) hours as needed for severe pain. 8 tablet 0  . Rivaroxaban 15 & 20 MG TBPK Take as directed on package: Start with one 15mg  tablet by mouth twice a day with food. On Day 22, switch to one 20mg  tablet once a day with food. 51 each 0   No facility-administered medications prior to visit.   Past Medical History:  Diagnosis Date  . Abnormal weight gain 11/30/2007  . Blood clot in vein    right leg  . DEPRESSION 11/30/2007   Qualifier: Diagnosis of  By: Loanne Drilling MD, Sean A   . Essential hypertension, benign 02/11/2013  . Hyperlipidemia   . Hypertension    . Hypothyroidism   . Obesity, unspecified 02/11/2013  . Other and unspecified hyperlipidemia 02/11/2013  . Other malaise and fatigue 02/11/2013  . PMS (premenstrual syndrome)   . Routine general medical examination at a health care facility 02/11/2013   Past Surgical History:  Procedure Laterality Date  . APPENDECTOMY    . CESAREAN SECTION    . CHOLECYSTECTOMY    . LUMBAR DISC SURGERY    . SHOULDER ARTHROSCOPY     Social History   Socioeconomic History  . Marital status: Married    Spouse name: Not on file  . Number of children: Not on file  . Years of education: Not on file  . Highest education level: Not on file  Occupational History  . Occupation: Therapist, sports: [employer]  Tobacco Use  . Smoking status: Never Smoker  . Smokeless tobacco: Never Used  Substance and Sexual Activity  . Alcohol use: No  . Drug use: No  . Sexual activity: Yes    Partners: Male  Other Topics Concern  . Not on file  Social History Narrative   Marital Status: Married Journalist, newspaper)    Children:  Sons (2)    Pets:  Dog (1)    Living Situation: Lives with husband and sons.     Occupation: Curator) Therapist, sports    Education: Programmer, systems    Tobacco: Never smoker   Alcohol Use:  None   Drug Use:  None   Diet:  Regular   Exercise:  Walking (3-5 X per week)     Hobbies:Scrapbooking            Social Determinants of Health   Financial Resource Strain:   . Difficulty of Paying Living Expenses: Not on file  Food Insecurity:   . Worried About Charity fundraiser in the Last Year: Not on file  . Ran Out of Food in the Last Year: Not on file  Transportation Needs:   . Lack of Transportation (Medical): Not on file  . Lack of Transportation (Non-Medical): Not on file  Physical Activity:   . Days of Exercise per Week: Not on file  . Minutes of Exercise per Session: Not on file  Stress:   . Feeling of Stress : Not on file  Social Connections:   . Frequency  of Communication with Friends and Family: Not on file  . Frequency of Social Gatherings with Friends and Family: Not on file  . Attends Religious Services: Not on file  . Active Member of Clubs or Organizations: Not on file  . Attends Archivist Meetings: Not on file  . Marital Status: Not on file   Family History  Problem Relation Age of Onset  . Leukemia Mother        died at 39  . Asthma Son   . Diabetes Maternal Grandmother        Type II  . Heart disease Father   . Colon cancer Neg Hx   . Stomach cancer Neg Hx   . Esophageal cancer Neg Hx   . Pancreatic cancer Neg Hx       Review of Systems: Pertinent positive and negative review of systems were noted in the above HPI section. All other review of systems were otherwise negative.   Physical Exam: General: Well developed, well nourished, no acute distress Head: Normocephalic and atraumatic Eyes:  sclerae anicteric, EOMI Ears: Normal auditory acuity Mouth: No deformity or lesions Neck: Supple, no masses or thyromegaly Lungs: Clear throughout to auscultation Heart: Regular rate and rhythm; no murmurs, rubs or bruits Abdomen: Soft, non tender and non distended. No masses, hepatosplenomegaly or hernias noted. Normal Bowel sounds Rectal: Deferred to colonoscopy  Musculoskeletal: Symmetrical with no gross deformities  Skin: No lesions on visible extremities Pulses:  Normal pulses noted Extremities: No clubbing, cyanosis, edema or deformities noted Neurological: Alert oriented x 4, grossly nonfocal Cervical Nodes:  No significant cervical adenopathy Inguinal Nodes: No significant inguinal adenopathy Psychological:  Alert and cooperative. Normal mood and affect   Assessment and Recommendations:  1. FIT positive stool.  Rule out colorectal neoplasms and other disorders.  Schedule colonoscopy. The risks (including bleeding, perforation, infection, missed lesions, medication reactions and possible hospitalization or  surgery if complications occur), benefits, and alternatives to colonoscopy with possible biopsy and possible polypectomy were discussed with the patient and they consent to proceed.   2.  Status post cholecystectomy and status post appendectomy.  cc: Evelene Croon, MD [place] [address] Alvira Monday,  [place] [id number]

## 2019-10-09 NOTE — Patient Instructions (Signed)
You have been scheduled for a colonoscopy. Please follow written instructions given to you at your visit today.  Please pick up your prep supplies at the pharmacy within the next 1-3 days. If you use inhalers (even only as needed), please bring them with you on the day of your procedure.  Normal BMI (Body Mass Index- based on height and weight) is between 19 and 25. Your BMI today is Body mass index is 36.72 kg/m. Marland Kitchen Please consider follow up  regarding your BMI with your Primary Care Provider.  Thank you for choosing me and [facility].  Pricilla Riffle. Dagoberto Ligas., MD., Marval Regal

## 2019-10-18 ENCOUNTER — Other Ambulatory Visit: Payer: Self-pay | Admitting: Gastroenterology

## 2019-10-18 ENCOUNTER — Ambulatory Visit (INDEPENDENT_AMBULATORY_CARE_PROVIDER_SITE_OTHER): Payer: Commercial Managed Care - PPO

## 2019-10-18 ENCOUNTER — Other Ambulatory Visit: Payer: Self-pay

## 2019-10-18 DIAGNOSIS — Z1159 Encounter for screening for other viral diseases: Secondary | ICD-10-CM

## 2019-10-19 LAB — SARS CORONAVIRUS 2 (TAT 6-24 HRS): SARS Coronavirus 2: NEGATIVE

## 2019-10-22 ENCOUNTER — Ambulatory Visit (AMBULATORY_SURGERY_CENTER): Payer: Commercial Managed Care - PPO | Admitting: Gastroenterology

## 2019-10-22 ENCOUNTER — Other Ambulatory Visit: Payer: Self-pay

## 2019-10-22 ENCOUNTER — Encounter: Payer: Self-pay | Admitting: Gastroenterology

## 2019-10-22 VITALS — BP 134/81 | HR 69 | Temp 96.8°F | Resp 13 | Ht 62.5 in | Wt 204.0 lb

## 2019-10-22 DIAGNOSIS — D124 Benign neoplasm of descending colon: Secondary | ICD-10-CM | POA: Diagnosis not present

## 2019-10-22 DIAGNOSIS — C182 Malignant neoplasm of ascending colon: Secondary | ICD-10-CM

## 2019-10-22 DIAGNOSIS — R195 Other fecal abnormalities: Secondary | ICD-10-CM

## 2019-10-22 DIAGNOSIS — D120 Benign neoplasm of cecum: Secondary | ICD-10-CM

## 2019-10-22 DIAGNOSIS — C180 Malignant neoplasm of cecum: Secondary | ICD-10-CM | POA: Diagnosis not present

## 2019-10-22 MED ORDER — SODIUM CHLORIDE 0.9 % IV SOLN: 500.0000 mL | Freq: Once | INTRAVENOUS | Status: DC

## 2019-10-22 NOTE — Progress Notes (Signed)
Pt's states no medical or surgical changes since previsit or office visit.  [place]

## 2019-10-22 NOTE — Progress Notes (Signed)
To PACU, VSS. Report to [email or web address] 

## 2019-10-22 NOTE — Patient Instructions (Signed)
Handout provided on polyps.   YOU HAD AN ENDOSCOPIC PROCEDURE TODAY AT [facility]:   Refer to the procedure report that was given to you for any specific questions about what was found during the examination.  If the procedure report does not answer your questions, please call your gastroenterologist to clarify.  If you requested that your care partner not be given the details of your procedure findings, then the procedure report has been included in a sealed envelope for you to review at your convenience later.  YOU SHOULD EXPECT: Some feelings of bloating in the abdomen. Passage of more gas than usual.  Walking can help get rid of the air that was put into your GI tract during the procedure and reduce the bloating. If you had a lower endoscopy (such as a colonoscopy or flexible sigmoidoscopy) you may notice spotting of blood in your stool or on the toilet paper. If you underwent a bowel prep for your procedure, you may not have a normal bowel movement for a few days.  Please Note:  You might notice some irritation and congestion in your nose or some drainage.  This is from the oxygen used during your procedure.  There is no need for concern and it should clear up in a day or so.  SYMPTOMS TO REPORT IMMEDIATELY:   Following lower endoscopy (colonoscopy or flexible sigmoidoscopy):  Excessive amounts of blood in the stool  Significant tenderness or worsening of abdominal pains  Swelling of the abdomen that is new, acute  Fever of 100F or higher  For urgent or emergent issues, a gastroenterologist can be reached at any hour by calling [phone].   DIET:  We do recommend a small meal at first, but then you may proceed to your regular diet.  Drink plenty of fluids but you should avoid alcoholic beverages for 24 hours.  ACTIVITY:  You should plan to take it easy for the rest of today and you should NOT DRIVE or use heavy machinery until tomorrow (because of the sedation  medicines used during the test).    FOLLOW UP: Our staff will call the number listed on your records 48-72 hours following your procedure to check on you and address any questions or concerns that you may have regarding the information given to you following your procedure. If we do not reach you, we will leave a message.  We will attempt to reach you two times.  During this call, we will ask if you have developed any symptoms of COVID 19. If you develop any symptoms (ie: fever, flu-like symptoms, shortness of breath, cough etc.) before then, please call [phone].  If you test positive for Covid 19 in the 2 weeks post procedure, please call and report this information to [place].    If any biopsies were taken you will be contacted by phone or by letter within the next 1-3 weeks.  Please call us at [phone] if you have not heard about the biopsies in 3 weeks.    SIGNATURES/CONFIDENTIALITY: You and/or your care partner have signed paperwork which will be entered into your electronic medical record.  These signatures attest to the fact that that the information above on your After Visit Summary has been reviewed and is understood.  Full responsibility of the confidentiality of this discharge information lies with you and/or your care-partner.

## 2019-10-22 NOTE — Op Note (Signed)
[facility] Patient Name: Kaitlyn Avila Procedure Date: 10/22/2019 10:44 AM MRN: [id number] Endoscopist: Ladene Artist , MD Age: 59 Referring MD:  Date of Birth: [birth year] Gender: Female Account #: [id number] Procedure:                Colonoscopy Indications:              Positive fecal immunochemical test Medicines:                Monitored Anesthesia Care Procedure:                Pre-Anesthesia Assessment:                           - Prior to the procedure, a History and Physical                            was performed, and patient medications and                            allergies were reviewed. The patient's tolerance of                            previous anesthesia was also reviewed. The risks                            and benefits of the procedure and the sedation                            options and risks were discussed with the patient.                            All questions were answered, and informed consent                            was obtained. Prior Anticoagulants: The patient has                            taken no previous anticoagulant or antiplatelet                            agents. ASA Grade Assessment: II - A patient with                            mild systemic disease. After reviewing the risks                            and benefits, the patient was deemed in                            satisfactory condition to undergo the procedure.                           After obtaining informed consent, the colonoscope  was passed under direct vision. Throughout the                            procedure, the patient's blood pressure, pulse, and                            oxygen saturations were monitored continuously. The                            Colonoscope was introduced through the anus and                            advanced to the the cecum, identified by                            appendiceal orifice and ileocecal  valve. The                            ileocecal valve, appendiceal orifice, and rectum                            were photographed. The quality of the bowel                            preparation was excellent. The colonoscopy was                            performed without difficulty. The patient tolerated                            the procedure well. Scope In: 10:48:36 AM Scope Out: 11:09:25 AM Scope Withdrawal Time: 0 hours 18 minutes 8 seconds  Total Procedure Duration: 0 hours 20 minutes 49 seconds  Findings:                 The perianal and digital rectal examinations were                            normal.                           An infiltrative non-obstructing small mass was                            found at the ileocecal valve. The mass was                            non-circumferential. The mass measured one cm in                            length. In addition, its diameter measured 1 cm. No                            bleeding was present. Biopsies were taken with a  cold forceps for histology. Area was tattooed with                            an injection of 3 mL of Spot (carbon black).                           A 8 mm polyp was found in the descending colon. The                            polyp was semi-pedunculated. The polyp was removed                            with a cold snare. Resection and retrieval were                            complete.                           The exam was otherwise without abnormality on                            direct and retroflexion views. Complications:            No immediate complications. Estimated blood loss:                            None. Estimated Blood Loss:     Estimated blood loss: none. Impression:               - Rule out malignancy, tumor at the ileocecal                            valve. Biopsied. Tattooed.                           - One 8 mm polyp in the descending colon, removed                             with a cold snare. Resected and retrieved.                           - The examination was otherwise normal on direct                            and retroflexion views. Recommendation:           - Repeat colonoscopy after studies are complete for                            surveillance based on pathology results.                           - Patient has a contact number available for                            emergencies.  The signs and symptoms of potential                            delayed complications were discussed with the                            patient. Return to normal activities tomorrow.                            Written discharge instructions were provided to the                            patient.                           - Resume previous diet.                           - Continue present medications.                           - Await pathology results. Ladene Artist, MD 10/22/2019 11:19:05 AM This report has been signed electronically.

## 2019-10-22 NOTE — Progress Notes (Signed)
Called to room to assist during endoscopic procedure.  Patient ID and intended procedure confirmed with present staff. Received instructions for my participation in the procedure from the performing physician.  

## 2019-10-24 ENCOUNTER — Telehealth: Payer: Self-pay | Admitting: *Deleted

## 2019-10-24 ENCOUNTER — Telehealth: Payer: Self-pay

## 2019-10-24 ENCOUNTER — Other Ambulatory Visit: Payer: Self-pay

## 2019-10-24 DIAGNOSIS — C801 Malignant (primary) neoplasm, unspecified: Secondary | ICD-10-CM

## 2019-10-24 NOTE — Telephone Encounter (Signed)
  Follow up Call-  Call back number 10/22/2019  Post procedure Call Back phone  # [phone]  Permission to leave phone message Yes  Some recent data might be hidden     Left message

## 2019-10-24 NOTE — Telephone Encounter (Signed)
  Follow up Call-  Call back number 10/22/2019  Post procedure Call Back phone  # [phone]  Permission to leave phone message Yes  Some recent data might be hidden     Patient questions:  Do you have a fever, pain , or abdominal swelling? No. Pain Score  0 *  Have you tolerated food without any problems? Yes.    Have you been able to return to your normal activities? Yes.    Do you have any questions about your discharge instructions: Diet   No. Medications  No. Follow up visit  No.  Do you have questions or concerns about your Care? No.  Actions: * If pain score is 4 or above: No action needed, pain <4.  Pt. Stated "all of y'all went above and beyond and I could not ask for better".   1. Have you developed a fever since your procedure? no  2.   Have you had an respiratory symptoms (SOB or cough) since your procedure? no  3.   Have you tested positive for COVID 19 since your procedure no  4.   Have you had any family members/close contacts diagnosed with the COVID 19 since your procedure?  no   If yes to any of these questions please route to Joylene John, RN and Alphonsa Gin, Therapist, sports.

## 2019-11-02 ENCOUNTER — Ambulatory Visit (HOSPITAL_COMMUNITY): Admission: RE | Admit: 2019-11-02 | Payer: Commercial Managed Care - PPO | Source: Ambulatory Visit

## 2019-11-05 ENCOUNTER — Ambulatory Visit (HOSPITAL_COMMUNITY)
Admission: RE | Admit: 2019-11-05 | Discharge: 2019-11-05 | Disposition: A | Discharge status: Home / Self Care | Payer: Commercial Managed Care - PPO | Source: Ambulatory Visit | Attending: Gastroenterology | Admitting: Gastroenterology

## 2019-11-05 ENCOUNTER — Telehealth: Payer: Self-pay | Admitting: Gastroenterology

## 2019-11-05 ENCOUNTER — Other Ambulatory Visit: Payer: Self-pay

## 2019-11-05 DIAGNOSIS — C801 Malignant (primary) neoplasm, unspecified: Secondary | ICD-10-CM | POA: Diagnosis not present

## 2019-11-05 LAB — POCT I-STAT CREATININE: Creatinine, Ser: 0.7 mg/dL (ref 0.44–1.00)

## 2019-11-05 MED ORDER — GADOBUTROL 1 MMOL/ML IV SOLN
7.0000 mL | Freq: Once | INTRAVENOUS | Status: AC | PRN
  Administered 2019-11-05: 13:00:00 7 mL via INTRAVENOUS

## 2019-11-05 NOTE — Telephone Encounter (Signed)
I spoke with the patient and she stated her husband had some questions.  I left a message for him to return call [phone] [facility]

## 2019-11-05 NOTE — Telephone Encounter (Signed)
I spoke with the patient's husband and all questions answered.  He is asking about surgery and recovery times.  I explained that Dr. Marcello Moores will be able to provide more details at her appt on Thursday.  He is advised I will call with the MRI results once reviewed by Dr. Fuller Plan.

## 2019-11-08 ENCOUNTER — Ambulatory Visit: Payer: Self-pay | Admitting: General Surgery

## 2019-11-08 NOTE — H&P (Signed)
The patient is a 59 year old female who presents with colorectal cancer. 59 year old female who underwent FIT test and was noted to be positive. She was referred to GI for colonoscopy. This showed an ascending colon mass. Biopsy showed adenocarcinoma. The mass was tattooed. It was felt to be near the ileocecal valve. Patient has a IV contrast allergy and therefore underwent MR scans of the abdomen and pelvis. This did not show any localization of the tumor. There was some mildly enlarged pericolonic lymph nodes noted. There was no sign of metastatic disease. The patient has not underwent a CT scan of her chest or CEA level. She reports regular bowel habits. She does have a history of an exploratory laparotomy as a teenager due to perforated appendicitis and a laparoscopic cholecystectomy approximately 20 years ago.   Past Surgical History Sabino Gasser, CMA; 11/08/2019 1:57 PM) Appendectomy Cesarean Section - Multiple Gallbladder Surgery - Laparoscopic Shoulder Surgery Right.  Diagnostic Studies History Sabino Gasser, CMA; 11/08/2019 1:57 PM) Colonoscopy within last year Pap Smear 1-5 years ago  Allergies Sabino Gasser, CMA; 11/08/2019 1:57 PM) No Known Drug Allergies [11/08/2019]: Allergies Reconciled  Medication History Sabino Gasser, CMA; 11/08/2019 1:58 PM) FLUoxetine HCl (20MG  Capsule, Oral) Active. Levothyroxine Sodium (112MCG Tablet, Oral) Active. Medications Reconciled  Social History Sabino Gasser, CMA; 11/08/2019 1:57 PM) Caffeine use Coffee, Tea. No alcohol use No drug use Tobacco use Never smoker.  Family History Sabino Gasser, [place]; 11/08/2019 1:57 PM) Alcohol Abuse Father. Bleeding disorder Mother. Melanoma Father.  Pregnancy / Birth History Sabino Gasser, [place]; 11/08/2019 1:57 PM) Age at menarche 71 years. Age of menopause 63-60 Maternal age 66-35 Regular periods  Other Problems Sabino Gasser, CMA; 11/08/2019 1:57 PM) Anxiety  Disorder Back Pain Cholelithiasis Gastroesophageal Reflux Disease Heart murmur Thyroid Disease     Review of Systems Sabino Gasser CMA; 11/08/2019 1:57 PM) General Present- Chills, Fatigue, Night Sweats and Weight Gain. Not Present- Appetite Loss, Fever and Weight Loss. Skin Not Present- Change in Wart/Mole, Dryness, Hives, Jaundice, New Lesions, Non-Healing Wounds, Rash and Ulcer. HEENT Present- Wears glasses/contact lenses. Not Present- Earache, Hearing Loss, Hoarseness, Nose Bleed, Oral Ulcers, Ringing in the Ears, Seasonal Allergies, Sinus Pain, Sore Throat, Visual Disturbances and Yellow Eyes. Breast Not Present- Breast Mass, Breast Pain, Nipple Discharge and Skin Changes. Gastrointestinal Present- Change in Bowel Habits. Not Present- Abdominal Pain, Bloating, Bloody Stool, Chronic diarrhea, Constipation, Difficulty Swallowing, Excessive gas, Gets full quickly at meals, Hemorrhoids, Indigestion, Nausea, Rectal Pain and Vomiting. Female Genitourinary Not Present- Frequency, Nocturia, Painful Urination, Pelvic Pain and Urgency. Neurological Not Present- Decreased Memory, Fainting, Headaches, Numbness, Seizures, Tingling, Tremor, Trouble walking and Weakness. Psychiatric Not Present- Anxiety, Bipolar, Change in Sleep Pattern, Depression, Fearful and Frequent crying. Endocrine Present- Hair Changes. Not Present- Cold Intolerance, Excessive Hunger, Heat Intolerance, Hot flashes and New Diabetes.  Vitals Sabino Gasser CMA; 11/08/2019 1:59 PM) 11/08/2019 1:58 PM Weight: 208.8 lb Height: 63in Body Surface Area: 1.97 m Body Mass Index: 36.99 kg/m  Temp.: 97.58F(Tympanic)  Pulse: 81 (Regular)  BP: 110/72 (Sitting, Left Arm, Standard)        Physical Exam Leighton Ruff MD; [id number] 2:33 PM)  General Mental Status-Alert. General Appearance-Cooperative.  Chest and Lung Exam Chest and lung exam reveals -normal excursion with symmetric chest  walls.  Abdomen Inspection Skin - Scar - Periumbilical. Palpation/Percussion Palpation and Percussion of the abdomen reveal - Soft and Non Tender.  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 This was identified on colonoscopy after a positive FIT. Biopsy show adenocarcinoma. The lesion was tattooed. MRI of the abdomen and pelvis were performed and showed no signs of metastatic disease. We will have her undergo a CT chest to complete her metastatic workup. We will get a CEA with her preop lab work. We will plan on doing a robotic-assisted partial colectomy. We have discussed this in detail. All questions were answered. The surgery and anatomy were described to the patient as well as the risks of surgery and the possible complications. These include: Bleeding, deep abdominal infections and possible wound complications such as hernia and infection, damage to adjacent structures, leak of surgical connections, which can lead to other surgeries and possibly an ostomy, possible need for other procedures, such as abscess drains in radiology, possible prolonged hospital stay, possible diarrhea from removal of part of the colon, possible constipation from narcotics, possible bowel, bladder or sexual dysfunction if having rectal surgery, prolonged fatigue/weakness or appetite loss, possible early recurrence of of disease, possible complications of their medical problems such as heart disease or arrhythmias or lung problems, death (less than 1%). I believe the patient understands and wishes to proceed with the surgery.

## 2019-11-08 NOTE — H&P (View-Only) (Signed)
The patient is a 59 year old female who presents with colorectal cancer. 59 year old female who underwent FIT test and was noted to be positive. She was referred to GI for colonoscopy. This showed an ascending colon mass. Biopsy showed adenocarcinoma. The mass was tattooed. It was felt to be near the ileocecal valve. Patient has a IV contrast allergy and therefore underwent MR scans of the abdomen and pelvis. This did not show any localization of the tumor. There was some mildly enlarged pericolonic lymph nodes noted. There was no sign of metastatic disease. The patient has not underwent a CT scan of her chest or CEA level. She reports regular bowel habits. She does have a history of an exploratory laparotomy as a teenager due to perforated appendicitis and a laparoscopic cholecystectomy approximately 20 years ago.   Past Surgical History Sabino Gasser, CMA; 11/08/2019 1:57 PM) Appendectomy Cesarean Section - Multiple Gallbladder Surgery - Laparoscopic Shoulder Surgery Right.  Diagnostic Studies History Sabino Gasser, CMA; 11/08/2019 1:57 PM) Colonoscopy within last year Pap Smear 1-5 years ago  Allergies Sabino Gasser, CMA; 11/08/2019 1:57 PM) No Known Drug Allergies [11/08/2019]: Allergies Reconciled  Medication History Sabino Gasser, CMA; 11/08/2019 1:58 PM) FLUoxetine HCl (20MG  Capsule, Oral) Active. Levothyroxine Sodium (112MCG Tablet, Oral) Active. Medications Reconciled  Social History Sabino Gasser, CMA; 11/08/2019 1:57 PM) Caffeine use Coffee, Tea. No alcohol use No drug use Tobacco use Never smoker.  Family History Sabino Gasser, [place]; 11/08/2019 1:57 PM) Alcohol Abuse Father. Bleeding disorder Mother. Melanoma Father.  Pregnancy / Birth History Sabino Gasser, [place]; 11/08/2019 1:57 PM) Age at menarche 44 years. Age of menopause 60-60 Maternal age 60-35 Regular periods  Other Problems Sabino Gasser, CMA; 11/08/2019 1:57 PM) Anxiety  Disorder Back Pain Cholelithiasis Gastroesophageal Reflux Disease Heart murmur Thyroid Disease     Review of Systems Sabino Gasser CMA; 11/08/2019 1:57 PM) General Present- Chills, Fatigue, Night Sweats and Weight Gain. Not Present- Appetite Loss, Fever and Weight Loss. Skin Not Present- Change in Wart/Mole, Dryness, Hives, Jaundice, New Lesions, Non-Healing Wounds, Rash and Ulcer. HEENT Present- Wears glasses/contact lenses. Not Present- Earache, Hearing Loss, Hoarseness, Nose Bleed, Oral Ulcers, Ringing in the Ears, Seasonal Allergies, Sinus Pain, Sore Throat, Visual Disturbances and Yellow Eyes. Breast Not Present- Breast Mass, Breast Pain, Nipple Discharge and Skin Changes. Gastrointestinal Present- Change in Bowel Habits. Not Present- Abdominal Pain, Bloating, Bloody Stool, Chronic diarrhea, Constipation, Difficulty Swallowing, Excessive gas, Gets full quickly at meals, Hemorrhoids, Indigestion, Nausea, Rectal Pain and Vomiting. Female Genitourinary Not Present- Frequency, Nocturia, Painful Urination, Pelvic Pain and Urgency. Neurological Not Present- Decreased Memory, Fainting, Headaches, Numbness, Seizures, Tingling, Tremor, Trouble walking and Weakness. Psychiatric Not Present- Anxiety, Bipolar, Change in Sleep Pattern, Depression, Fearful and Frequent crying. Endocrine Present- Hair Changes. Not Present- Cold Intolerance, Excessive Hunger, Heat Intolerance, Hot flashes and New Diabetes.  Vitals Sabino Gasser CMA; 11/08/2019 1:59 PM) 11/08/2019 1:58 PM Weight: 208.8 lb Height: 63in Body Surface Area: 1.97 m Body Mass Index: 36.99 kg/m  Temp.: 97.47F(Tympanic)  Pulse: 81 (Regular)  BP: 110/72 (Sitting, Left Arm, Standard)        Physical Exam Leighton Ruff MD; [id number] 2:33 PM)  General Mental Status-Alert. General Appearance-Cooperative.  Chest and Lung Exam Chest and lung exam reveals -normal excursion with symmetric chest  walls.  Abdomen Inspection Skin - Scar - Periumbilical. Palpation/Percussion Palpation and Percussion of the abdomen reveal - Soft and Non Tender.  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 This was identified on colonoscopy after a positive FIT. Biopsy show adenocarcinoma. The lesion was tattooed. MRI of the abdomen and pelvis were performed and showed no signs of metastatic disease. We will have her undergo a CT chest to complete her metastatic workup. We will get a CEA with her preop lab work. We will plan on doing a robotic-assisted partial colectomy. We have discussed this in detail. All questions were answered. The surgery and anatomy were described to the patient as well as the risks of surgery and the possible complications. These include: Bleeding, deep abdominal infections and possible wound complications such as hernia and infection, damage to adjacent structures, leak of surgical connections, which can lead to other surgeries and possibly an ostomy, possible need for other procedures, such as abscess drains in radiology, possible prolonged hospital stay, possible diarrhea from removal of part of the colon, possible constipation from narcotics, possible bowel, bladder or sexual dysfunction if having rectal surgery, prolonged fatigue/weakness or appetite loss, possible early recurrence of of disease, possible complications of their medical problems such as heart disease or arrhythmias or lung problems, death (less than 1%). I believe the patient understands and wishes to proceed with the surgery.

## 2019-11-16 ENCOUNTER — Other Ambulatory Visit (HOSPITAL_COMMUNITY): Payer: Self-pay | Admitting: General Surgery

## 2019-11-16 ENCOUNTER — Other Ambulatory Visit: Payer: Self-pay | Admitting: General Surgery

## 2019-11-16 DIAGNOSIS — C182 Malignant neoplasm of ascending colon: Secondary | ICD-10-CM

## 2019-11-20 NOTE — Progress Notes (Signed)
PCP - Chinita Pester, MD  Cardiologist - Jenkins Rouge, MD  Chest x-ray -  EKG - 02-26-19 Stress Test -  ECHO - 11-05-18  Cardiac Cath -   Sleep Study -  CPAP -   Fasting Blood Sugar -  Checks Blood Sugar _____ times a day  Blood Thinner Instructions: Aspirin Instructions: Last Dose:  Anesthesia review:   Patient denies shortness of breath, fever, cough and chest pain at PAT appointment   Patient verbalized understanding of instructions that were given to them at the PAT appointment. Patient was also instructed that they will need to review over the PAT instructions again at home before surgery.

## 2019-11-20 NOTE — Patient Instructions (Addendum)
DUE TO COVID-19 ONLY ONE VISITOR IS ALLOWED TO COME WITH YOU AND STAY IN THE WAITING ROOM ONLY DURING PRE OP AND PROCEDURE DAY OF SURGERY. THE 1 VISITOR MAY VISIT WITH YOU AFTER SURGERY IN YOUR PRIVATE ROOM DURING VISITING HOURS ONLY!  YOU NEED TO HAVE A COVID 19 TEST ON 11-26-19@9 :35 AM, THIS TEST MUST BE DONE BEFORE SURGERY, COME  Kaitlyn Avila , [id number].  ([place]) ONCE YOUR COVID TEST IS COMPLETED, PLEASE BEGIN THE QUARANTINE INSTRUCTIONS AS OUTLINED IN YOUR HANDOUT.                Kaitlyn Avila  11/20/2019   Your procedure is scheduled on: 11-29-19    Report to [facility] Main  Entrance    Report to Admitting at 11:30 AM     Call this number if you have problems the morning of surgery [phone]   Please follow a Clear Liquid Diet The Day Before to Prevent Dehydration, per your surgeon's instructions.   Remember: DRINK 2 PRESURGERY ENSURE DRINKS THE NIGHT BEFORE SURGERY AT 1000 PM AND 1 PRESURGERY DRINK THE DAY OF THE PROCEDURE 3 HOURS PRIOR TO SCHEDULED SURGERY. NO SOLIDS AFTER MIDNIGHT THE DAY PRIOR TO THE SURGERY. NOTHING BY MOUTH EXCEPT CLEAR LIQUIDS UNTIL THREE HOURS PRIOR TO SCHEDULED SURGERY. PLEASE FINISH PRESURGERY ENSURE DRINK PER SURGEON ORDER 3 HOURS PRIOR TO SCHEDULED SURGERY TIME WHICH NEEDS TO BE COMPLETED AT 10:30 AM.    CLEAR LIQUID DIET   Foods Allowed                                                                     Foods Excluded  Coffee and tea, regular and decaf                             liquids that you cannot  Plain Jell-O any favor except red or purple                                           see through such as: Fruit ices (not with fruit pulp)                                     milk, soups, orange juice  Iced Popsicles                                    All solid food Carbonated beverages, regular and diet                                    Cranberry, grape and apple juices Sports drinks like  Gatorade Lightly seasoned clear broth or consume(fat free) Sugar, honey syrup  Sample Menu Breakfast  Lunch                                     Supper Cranberry juice                    Beef broth                            Chicken broth Jell-O                                     Grape juice                           Apple juice Coffee or tea                        Jell-O                                      Popsicle                                                Coffee or tea                        Coffee or tea  _____________________________________________________________________      Take these medicines the morning of surgery with A SIP OF WATER: None   BRUSH YOUR TEETH MORNING OF SURGERY AND RINSE YOUR MOUTH OUT, NO CHEWING GUM CANDY OR MINTS.                                 You may not have any metal on your body including hair pins and              piercings     Do not wear jewelry, make-up, lotions, powders or perfumes, deodorant              Do not wear nail polish on your fingernails.  Do not shave  48 hours prior to surgery.     Do not bring valuables to the hospital. [place].  Contacts, dentures or bridgework may not be worn into surgery.  You may bring an overnight bag     Special Instructions: N/A              Please read over the following fact sheets you were given: _____________________________________________________________________             [facility] - Preparing for Surgery Before surgery, you can play an important role.  Because skin is not sterile, your skin needs to be as free of germs as possible.  You can reduce the number of germs on your skin by washing with CHG (chlorahexidine gluconate) soap before surgery.  CHG is an antiseptic cleaner which kills germs and bonds with the skin to  continue killing germs even after washing. Please DO NOT use if you have an  allergy to CHG or antibacterial soaps.  If your skin becomes reddened/irritated stop using the CHG and inform your nurse when you arrive at Short Stay. Do not shave (including legs and underarms) for at least 48 hours prior to the first CHG shower.  You may shave your face/neck. Please follow these instructions carefully:  1.  Shower with CHG Soap the night before surgery and the  morning of Surgery.  2.  If you choose to wash your hair, wash your hair first as usual with your  normal  shampoo.  3.  After you shampoo, rinse your hair and body thoroughly to remove the  shampoo.                           4.  Use CHG as you would any other liquid soap.  You can apply chg directly  to the skin and wash                       Gently with a scrungie or clean washcloth.  5.  Apply the CHG Soap to your body ONLY FROM THE NECK DOWN.   Do not use on face/ open                           Wound or open sores. Avoid contact with eyes, ears mouth and genitals (private parts).                       Wash face,  Genitals (private parts) with your normal soap.             6.  Wash thoroughly, paying special attention to the area where your surgery  will be performed.  7.  Thoroughly rinse your body with warm water from the neck down.  8.  DO NOT shower/wash with your normal soap after using and rinsing off  the CHG Soap.                9.  Pat yourself dry with a clean towel.            10.  Wear clean pajamas.            11.  Place clean sheets on your bed the night of your first shower and do not  sleep with pets. Day of Surgery : Do not apply any lotions/deodorants the morning of surgery.  Please wear clean clothes to the hospital/surgery center.  FAILURE TO FOLLOW THESE INSTRUCTIONS MAY RESULT IN THE CANCELLATION OF YOUR SURGERY PATIENT SIGNATURE_________________________________  NURSE  SIGNATURE__________________________________  ________________________________________________________________________  WHAT IS A BLOOD TRANSFUSION? Blood Transfusion Information  A transfusion is the replacement of blood or some of its parts. Blood is made up of multiple cells which provide different functions.  Red blood cells carry oxygen and are used for blood loss replacement.  White blood cells fight against infection.  Platelets control bleeding.  Plasma helps clot blood.  Other blood products are available for specialized needs, such as hemophilia or other clotting disorders. BEFORE THE TRANSFUSION  Who gives blood for transfusions?   Healthy volunteers who are fully evaluated to make sure their blood is safe. This is blood bank blood. Transfusion therapy is the safest it has ever been in the practice of medicine. Before blood  is taken from a donor, a complete history is taken to make sure that person has no history of diseases nor engages in risky social behavior (examples are intravenous drug use or sexual activity with multiple partners). The donor's travel history is screened to minimize risk of transmitting infections, such as malaria. The donated blood is tested for signs of infectious diseases, such as HIV and hepatitis. The blood is then tested to be sure it is compatible with you in order to minimize the chance of a transfusion reaction. If you or a relative donates blood, this is often done in anticipation of surgery and is not appropriate for emergency situations. It takes many days to process the donated blood. RISKS AND COMPLICATIONS Although transfusion therapy is very safe and saves many lives, the main dangers of transfusion include:   Getting an infectious disease.  Developing a transfusion reaction. This is an allergic reaction to something in the blood you were given. Every precaution is taken to prevent this. The decision to have a blood transfusion has been  considered carefully by your caregiver before blood is given. Blood is not given unless the benefits outweigh the risks. AFTER THE TRANSFUSION  Right after receiving a blood transfusion, you will usually feel much better and more energetic. This is especially true if your red blood cells have gotten low (anemic). The transfusion raises the level of the red blood cells which carry oxygen, and this usually causes an energy increase.  The nurse administering the transfusion will monitor you carefully for complications. HOME CARE INSTRUCTIONS  No special instructions are needed after a transfusion. You may find your energy is better. Speak with your caregiver about any limitations on activity for underlying diseases you may have. SEEK MEDICAL CARE IF:   Your condition is not improving after your transfusion.  You develop redness or irritation at the intravenous (IV) site. SEEK IMMEDIATE MEDICAL CARE IF:  Any of the following symptoms occur over the next 12 hours:  Shaking chills.  You have a temperature by mouth above 102 F (38.9 C), not controlled by medicine.  Chest, back, or muscle pain.  People around you feel you are not acting correctly or are confused.  Shortness of breath or difficulty breathing.  Dizziness and fainting.  You get a rash or develop hives.  You have a decrease in urine output.  Your urine turns a dark color or changes to pink, red, or brown. Any of the following symptoms occur over the next 10 days:  You have a temperature by mouth above 102 F (38.9 C), not controlled by medicine.  Shortness of breath.  Weakness after normal activity.  The white part of the eye turns yellow (jaundice).  You have a decrease in the amount of urine or are urinating less often.  Your urine turns a dark color or changes to pink, red, or brown. Document Released: 08/20/2000 Document Revised: 11/15/2011 Document Reviewed: 04/08/2008 [facility] Patient Information 2014  [place], [place].  _______________________________________________________________________

## 2019-11-21 ENCOUNTER — Other Ambulatory Visit: Payer: Self-pay

## 2019-11-21 ENCOUNTER — Encounter (HOSPITAL_COMMUNITY)
Admission: RE | Admit: 2019-11-21 | Discharge: 2019-11-21 | Disposition: A | Discharge status: Home / Self Care | Payer: Commercial Managed Care - PPO | Source: Ambulatory Visit | Attending: General Surgery | Admitting: General Surgery

## 2019-11-21 ENCOUNTER — Encounter (HOSPITAL_COMMUNITY): Payer: Self-pay

## 2019-11-26 ENCOUNTER — Encounter (HOSPITAL_COMMUNITY): Payer: Self-pay

## 2019-11-26 ENCOUNTER — Ambulatory Visit (HOSPITAL_COMMUNITY)
Admission: RE | Admit: 2019-11-26 | Discharge: 2019-11-26 | Disposition: A | Discharge status: Home / Self Care | Payer: Commercial Managed Care - PPO | Source: Ambulatory Visit | Attending: General Surgery | Admitting: General Surgery

## 2019-11-26 ENCOUNTER — Other Ambulatory Visit: Payer: Self-pay

## 2019-11-26 ENCOUNTER — Other Ambulatory Visit (HOSPITAL_COMMUNITY)
Admission: RE | Admit: 2019-11-26 | Discharge: 2019-11-26 | Disposition: A | Discharge status: Home / Self Care | Payer: Commercial Managed Care - PPO | Source: Ambulatory Visit | Attending: General Surgery | Admitting: General Surgery

## 2019-11-26 ENCOUNTER — Other Ambulatory Visit (HOSPITAL_COMMUNITY): Payer: Commercial Managed Care - PPO

## 2019-11-26 ENCOUNTER — Encounter (HOSPITAL_COMMUNITY)
Admission: RE | Admit: 2019-11-26 | Discharge: 2019-11-26 | Disposition: A | Discharge status: Home / Self Care | Payer: Commercial Managed Care - PPO | Source: Ambulatory Visit | Attending: General Surgery | Admitting: General Surgery

## 2019-11-26 DIAGNOSIS — C182 Malignant neoplasm of ascending colon: Secondary | ICD-10-CM

## 2019-11-26 DIAGNOSIS — Z20822 Contact with and (suspected) exposure to covid-19: Secondary | ICD-10-CM | POA: Insufficient documentation

## 2019-11-26 DIAGNOSIS — R05 Cough: Secondary | ICD-10-CM | POA: Insufficient documentation

## 2019-11-26 DIAGNOSIS — Z01812 Encounter for preprocedural laboratory examination: Secondary | ICD-10-CM | POA: Insufficient documentation

## 2019-11-26 DIAGNOSIS — Z85038 Personal history of other malignant neoplasm of large intestine: Secondary | ICD-10-CM | POA: Insufficient documentation

## 2019-11-26 DIAGNOSIS — R911 Solitary pulmonary nodule: Secondary | ICD-10-CM | POA: Insufficient documentation

## 2019-11-26 LAB — CBC
HCT: 41.8 % (ref 36.0–46.0)
Hemoglobin: 13.4 g/dL (ref 12.0–15.0)
MCH: 29.6 pg (ref 26.0–34.0)
MCHC: 32.1 g/dL (ref 30.0–36.0)
MCV: 92.5 fL (ref 80.0–100.0)
Platelets: 243 10*3/uL (ref 150–400)
RBC: 4.52 MIL/uL (ref 3.87–5.11)
RDW: 13.9 % (ref 11.5–15.5)
WBC: 5.8 10*3/uL (ref 4.0–10.5)
nRBC: 0 % (ref 0.0–0.2)

## 2019-11-26 LAB — BASIC METABOLIC PANEL
Anion gap: 11 (ref 5–15)
BUN: 18 mg/dL (ref 6–20)
CO2: 26 mmol/L (ref 22–32)
Calcium: 9.2 mg/dL (ref 8.9–10.3)
Chloride: 105 mmol/L (ref 98–111)
Creatinine, Ser: 0.72 mg/dL (ref 0.44–1.00)
GFR calc Af Amer: >60 mL/min (ref >60)
GFR calc non Af Amer: >60 mL/min (ref >60)
Glucose, Bld: 92 mg/dL (ref 70–99)
Potassium: 4.3 mmol/L (ref 3.5–5.1)
Sodium: 142 mmol/L (ref 135–145)

## 2019-11-26 LAB — ABO/RH: ABO/RH(D): A NEG

## 2019-11-26 LAB — SARS CORONAVIRUS 2 (TAT 6-24 HRS): SARS Coronavirus 2: NEGATIVE

## 2019-11-27 LAB — CEA: CEA: 1.2 ng/mL (ref 0.0–4.7)

## 2019-11-28 MED ORDER — BUPIVACAINE LIPOSOME 1.3 % IJ SUSP
20.0000 mL | Freq: Once | INTRAMUSCULAR | Status: DC
  Filled 2019-11-28: qty 20

## 2019-11-29 ENCOUNTER — Other Ambulatory Visit: Payer: Self-pay

## 2019-11-29 ENCOUNTER — Inpatient Hospital Stay (HOSPITAL_COMMUNITY): Payer: Commercial Managed Care - PPO | Admitting: Anesthesiology

## 2019-11-29 ENCOUNTER — Encounter (HOSPITAL_COMMUNITY)
Admission: RE | Disposition: A | Discharge status: Home / Self Care | Payer: Self-pay | Source: Home / Self Care | Attending: General Surgery

## 2019-11-29 ENCOUNTER — Encounter (HOSPITAL_COMMUNITY): Payer: Self-pay | Admitting: General Surgery

## 2019-11-29 ENCOUNTER — Inpatient Hospital Stay (HOSPITAL_COMMUNITY)
Admission: RE | Admit: 2019-11-29 | Discharge: 2019-12-01 | DRG: 331 | Disposition: A | Discharge status: Home / Self Care | Payer: Commercial Managed Care - PPO | Attending: General Surgery | Admitting: General Surgery

## 2019-11-29 DIAGNOSIS — K219 Gastro-esophageal reflux disease without esophagitis: Secondary | ICD-10-CM | POA: Diagnosis present

## 2019-11-29 DIAGNOSIS — Z832 Family history of diseases of the blood and blood-forming organs and certain disorders involving the immune mechanism: Secondary | ICD-10-CM | POA: Diagnosis not present

## 2019-11-29 DIAGNOSIS — Z6838 Body mass index (BMI) 38.0-38.9, adult: Secondary | ICD-10-CM | POA: Diagnosis not present

## 2019-11-29 DIAGNOSIS — Z808 Family history of malignant neoplasm of other organs or systems: Secondary | ICD-10-CM | POA: Diagnosis not present

## 2019-11-29 DIAGNOSIS — C182 Malignant neoplasm of ascending colon: Secondary | ICD-10-CM | POA: Diagnosis present

## 2019-11-29 DIAGNOSIS — Z811 Family history of alcohol abuse and dependence: Secondary | ICD-10-CM

## 2019-11-29 DIAGNOSIS — I10 Essential (primary) hypertension: Secondary | ICD-10-CM | POA: Diagnosis present

## 2019-11-29 DIAGNOSIS — C189 Malignant neoplasm of colon, unspecified: Secondary | ICD-10-CM | POA: Diagnosis present

## 2019-11-29 DIAGNOSIS — Z888 Allergy status to other drugs, medicaments and biological substances status: Secondary | ICD-10-CM | POA: Diagnosis not present

## 2019-11-29 DIAGNOSIS — Z20822 Contact with and (suspected) exposure to covid-19: Secondary | ICD-10-CM | POA: Diagnosis present

## 2019-11-29 DIAGNOSIS — Z79890 Hormone replacement therapy (postmenopausal): Secondary | ICD-10-CM

## 2019-11-29 DIAGNOSIS — E669 Obesity, unspecified: Secondary | ICD-10-CM | POA: Diagnosis present

## 2019-11-29 DIAGNOSIS — Z91041 Radiographic dye allergy status: Secondary | ICD-10-CM | POA: Diagnosis not present

## 2019-11-29 DIAGNOSIS — E039 Hypothyroidism, unspecified: Secondary | ICD-10-CM | POA: Diagnosis present

## 2019-11-29 DIAGNOSIS — Z881 Allergy status to other antibiotic agents status: Secondary | ICD-10-CM

## 2019-11-29 LAB — TYPE AND SCREEN
ABO/RH(D): A NEG
Antibody Screen: NEGATIVE

## 2019-11-29 SURGERY — COLECTOMY, PARTIAL, ROBOT-ASSISTED, LAPAROSCOPIC
Anesthesia: General | Site: Abdomen

## 2019-11-29 MED ORDER — SUGAMMADEX SODIUM 200 MG/2ML IV SOLN
INTRAVENOUS | Status: DC | PRN
  Administered 2019-11-29: 200 mg via INTRAVENOUS

## 2019-11-29 MED ORDER — GABAPENTIN 300 MG PO CAPS
300.0000 mg | ORAL_CAPSULE | ORAL | Status: AC
  Administered 2019-11-29: 300 mg via ORAL
  Filled 2019-11-29: qty 1

## 2019-11-29 MED ORDER — LIDOCAINE HCL 2 % IJ SOLN
INTRAMUSCULAR | Status: AC
  Filled 2019-11-29: qty 20

## 2019-11-29 MED ORDER — LIDOCAINE 2% (20 MG/ML) 5 ML SYRINGE
INTRAMUSCULAR | Status: AC
  Filled 2019-11-29: qty 5

## 2019-11-29 MED ORDER — PROPOFOL 10 MG/ML IV BOLUS
INTRAVENOUS | Status: AC
  Filled 2019-11-29: qty 20

## 2019-11-29 MED ORDER — ACETAMINOPHEN 500 MG PO TABS
1000.0000 mg | ORAL_TABLET | ORAL | Status: AC
  Administered 2019-11-29: 1000 mg via ORAL
  Filled 2019-11-29: qty 2

## 2019-11-29 MED ORDER — DIPHENHYDRAMINE HCL 50 MG/ML IJ SOLN: 25.0000 mg | Freq: Four times a day (QID) | INTRAMUSCULAR | Status: DC | PRN

## 2019-11-29 MED ORDER — PROPOFOL 10 MG/ML IV BOLUS
INTRAVENOUS | Status: DC | PRN
  Administered 2019-11-29: 170 mg via INTRAVENOUS

## 2019-11-29 MED ORDER — SCOPOLAMINE 1 MG/3DAYS TD PT72: 1.0000 | MEDICATED_PATCH | TRANSDERMAL | Status: DC

## 2019-11-29 MED ORDER — KETAMINE HCL 10 MG/ML IJ SOLN
INTRAMUSCULAR | Status: DC | PRN
  Administered 2019-11-29: 40 mg via INTRAVENOUS

## 2019-11-29 MED ORDER — OXYCODONE HCL 5 MG PO TABS: 5.0000 mg | ORAL_TABLET | Freq: Once | ORAL | Status: DC | PRN

## 2019-11-29 MED ORDER — LEVOTHYROXINE SODIUM 112 MCG PO TABS
112.0000 ug | ORAL_TABLET | Freq: Every day | ORAL | Status: DC
  Administered 2019-11-29 – 2019-11-30 (×2): 112 ug via ORAL
  Filled 2019-11-29 (×2): qty 1

## 2019-11-29 MED ORDER — ACETAMINOPHEN 500 MG PO TABS
1000.0000 mg | ORAL_TABLET | Freq: Four times a day (QID) | ORAL | Status: DC
  Administered 2019-11-29 – 2019-12-01 (×7): 1000 mg via ORAL
  Filled 2019-11-29 (×7): qty 2

## 2019-11-29 MED ORDER — ALVIMOPAN 12 MG PO CAPS
12.0000 mg | ORAL_CAPSULE | Freq: Two times a day (BID) | ORAL | Status: DC
  Administered 2019-11-30: 12 mg via ORAL
  Filled 2019-11-29 (×2): qty 1

## 2019-11-29 MED ORDER — HYDROMORPHONE HCL 1 MG/ML IJ SOLN
INTRAMUSCULAR | Status: AC
  Filled 2019-11-29: qty 1

## 2019-11-29 MED ORDER — SACCHAROMYCES BOULARDII 250 MG PO CAPS
250.0000 mg | ORAL_CAPSULE | Freq: Two times a day (BID) | ORAL | Status: DC
  Administered 2019-11-29 – 2019-11-30 (×3): 250 mg via ORAL
  Filled 2019-11-29 (×3): qty 1

## 2019-11-29 MED ORDER — SODIUM CHLORIDE 0.9 % IV SOLN
2.0000 g | INTRAVENOUS | Status: AC
  Administered 2019-11-29: 2 g via INTRAVENOUS
  Filled 2019-11-29: qty 2

## 2019-11-29 MED ORDER — FENTANYL CITRATE (PF) 250 MCG/5ML IJ SOLN
INTRAMUSCULAR | Status: DC | PRN
  Administered 2019-11-29: 50 ug via INTRAVENOUS
  Administered 2019-11-29: 100 ug via INTRAVENOUS

## 2019-11-29 MED ORDER — HYDROMORPHONE HCL 1 MG/ML IJ SOLN
0.5000 mg | INTRAMUSCULAR | Status: DC | PRN
  Administered 2019-11-29: 0.5 mg via INTRAVENOUS
  Filled 2019-11-29: qty 0.5

## 2019-11-29 MED ORDER — LACTATED RINGERS IR SOLN
Status: DC | PRN
  Administered 2019-11-29: 1000 mL

## 2019-11-29 MED ORDER — HEPARIN SODIUM (PORCINE) 5000 UNIT/ML IJ SOLN
5000.0000 [IU] | Freq: Once | INTRAMUSCULAR | Status: AC
  Administered 2019-11-29: 5000 [IU] via SUBCUTANEOUS
  Filled 2019-11-29: qty 1

## 2019-11-29 MED ORDER — BUPIVACAINE LIPOSOME 1.3 % IJ SUSP
INTRAMUSCULAR | Status: DC | PRN
  Administered 2019-11-29: 20 mL

## 2019-11-29 MED ORDER — EPHEDRINE 5 MG/ML INJ
INTRAVENOUS | Status: AC
  Filled 2019-11-29: qty 10

## 2019-11-29 MED ORDER — BUPIVACAINE-EPINEPHRINE (PF) 0.25% -1:200000 IJ SOLN
INTRAMUSCULAR | Status: AC
  Filled 2019-11-29: qty 30

## 2019-11-29 MED ORDER — FLUOXETINE HCL 20 MG PO CAPS
20.0000 mg | ORAL_CAPSULE | Freq: Every day | ORAL | Status: DC
  Administered 2019-11-29 – 2019-11-30 (×2): 20 mg via ORAL
  Filled 2019-11-29 (×2): qty 1

## 2019-11-29 MED ORDER — 0.9 % SODIUM CHLORIDE (POUR BTL) OPTIME
TOPICAL | Status: DC | PRN
  Administered 2019-11-29: 2000 mL

## 2019-11-29 MED ORDER — ENOXAPARIN SODIUM 40 MG/0.4ML ~~LOC~~ SOLN
40.0000 mg | SUBCUTANEOUS | Status: DC
  Administered 2019-11-30 – 2019-12-01 (×2): 40 mg via SUBCUTANEOUS
  Filled 2019-11-29 (×2): qty 0.4

## 2019-11-29 MED ORDER — BUPIVACAINE-EPINEPHRINE 0.25% -1:200000 IJ SOLN
INTRAMUSCULAR | Status: DC | PRN
  Administered 2019-11-29: 30 mL

## 2019-11-29 MED ORDER — ROCURONIUM BROMIDE 10 MG/ML (PF) SYRINGE
PREFILLED_SYRINGE | INTRAVENOUS | Status: AC
  Filled 2019-11-29: qty 10

## 2019-11-29 MED ORDER — MIDAZOLAM HCL 5 MG/5ML IJ SOLN
INTRAMUSCULAR | Status: DC | PRN
  Administered 2019-11-29: 2 mg via INTRAVENOUS

## 2019-11-29 MED ORDER — GABAPENTIN 300 MG PO CAPS
300.0000 mg | ORAL_CAPSULE | Freq: Two times a day (BID) | ORAL | Status: DC
  Administered 2019-11-29 – 2019-11-30 (×3): 300 mg via ORAL
  Filled 2019-11-29 (×3): qty 1

## 2019-11-29 MED ORDER — SODIUM CHLORIDE 0.9 % IV SOLN
2.0000 g | Freq: Two times a day (BID) | INTRAVENOUS | Status: AC
  Administered 2019-11-29: 2 g via INTRAVENOUS
  Filled 2019-11-29: qty 2

## 2019-11-29 MED ORDER — LIDOCAINE 20MG/ML (2%) 15 ML SYRINGE OPTIME
INTRAMUSCULAR | Status: DC | PRN
  Administered 2019-11-29: 1.5 mg/kg/h via INTRAVENOUS

## 2019-11-29 MED ORDER — KCL IN DEXTROSE-NACL 20-5-0.45 MEQ/L-%-% IV SOLN
INTRAVENOUS | Status: DC
  Filled 2019-11-29: qty 1000

## 2019-11-29 MED ORDER — FENTANYL CITRATE (PF) 250 MCG/5ML IJ SOLN
INTRAMUSCULAR | Status: AC
  Filled 2019-11-29: qty 5

## 2019-11-29 MED ORDER — ENSURE SURGERY PO LIQD
237.0000 mL | Freq: Two times a day (BID) | ORAL | Status: DC
  Administered 2019-11-30 (×2): 237 mL via ORAL
  Filled 2019-11-29 (×4): qty 237

## 2019-11-29 MED ORDER — ONDANSETRON HCL 4 MG/2ML IJ SOLN: 4.0000 mg | Freq: Four times a day (QID) | INTRAMUSCULAR | Status: DC | PRN

## 2019-11-29 MED ORDER — HYDROMORPHONE HCL 1 MG/ML IJ SOLN
0.2500 mg | INTRAMUSCULAR | Status: DC | PRN
  Administered 2019-11-29: 0.25 mg via INTRAVENOUS

## 2019-11-29 MED ORDER — DIPHENHYDRAMINE HCL 25 MG PO CAPS: 25.0000 mg | ORAL_CAPSULE | Freq: Four times a day (QID) | ORAL | Status: DC | PRN

## 2019-11-29 MED ORDER — KETAMINE HCL 10 MG/ML IJ SOLN
INTRAMUSCULAR | Status: AC
  Filled 2019-11-29: qty 1

## 2019-11-29 MED ORDER — LACTATED RINGERS IV SOLN: INTRAVENOUS | Status: DC

## 2019-11-29 MED ORDER — ROCURONIUM BROMIDE 100 MG/10ML IV SOLN
INTRAVENOUS | Status: DC | PRN
  Administered 2019-11-29: 10 mg via INTRAVENOUS
  Administered 2019-11-29: 20 mg via INTRAVENOUS
  Administered 2019-11-29: 10 mg via INTRAVENOUS
  Administered 2019-11-29: 70 mg via INTRAVENOUS

## 2019-11-29 MED ORDER — ONDANSETRON HCL 4 MG PO TABS
4.0000 mg | ORAL_TABLET | Freq: Four times a day (QID) | ORAL | Status: DC | PRN
  Administered 2019-11-29: 4 mg via ORAL
  Filled 2019-11-29: qty 1

## 2019-11-29 MED ORDER — PROMETHAZINE HCL 25 MG/ML IJ SOLN: 6.2500 mg | INTRAMUSCULAR | Status: DC | PRN

## 2019-11-29 MED ORDER — EPHEDRINE SULFATE-NACL 50-0.9 MG/10ML-% IV SOSY
PREFILLED_SYRINGE | INTRAVENOUS | Status: DC | PRN
  Administered 2019-11-29: 10 mg via INTRAVENOUS
  Administered 2019-11-29: 5 mg via INTRAVENOUS
  Administered 2019-11-29: 10 mg via INTRAVENOUS

## 2019-11-29 MED ORDER — ALVIMOPAN 12 MG PO CAPS
12.0000 mg | ORAL_CAPSULE | ORAL | Status: AC
  Administered 2019-11-29: 12 mg via ORAL
  Filled 2019-11-29: qty 1

## 2019-11-29 MED ORDER — ALUM & MAG HYDROXIDE-SIMETH 200-200-20 MG/5ML PO SUSP: 30.0000 mL | Freq: Four times a day (QID) | ORAL | Status: DC | PRN

## 2019-11-29 MED ORDER — ONDANSETRON HCL 4 MG/2ML IJ SOLN
INTRAMUSCULAR | Status: DC | PRN
  Administered 2019-11-29: 4 mg via INTRAVENOUS

## 2019-11-29 MED ORDER — LIDOCAINE HCL (CARDIAC) PF 100 MG/5ML IV SOSY
PREFILLED_SYRINGE | INTRAVENOUS | Status: DC | PRN
  Administered 2019-11-29: 60 mg via INTRAVENOUS

## 2019-11-29 MED ORDER — ONDANSETRON HCL 4 MG/2ML IJ SOLN
INTRAMUSCULAR | Status: AC
  Filled 2019-11-29: qty 2

## 2019-11-29 MED ORDER — MEPERIDINE HCL 50 MG/ML IJ SOLN: 6.2500 mg | INTRAMUSCULAR | Status: DC | PRN

## 2019-11-29 MED ORDER — SCOPOLAMINE 1 MG/3DAYS TD PT72
MEDICATED_PATCH | TRANSDERMAL | Status: AC
  Administered 2019-11-29: 1.5 mg via TRANSDERMAL
  Filled 2019-11-29: qty 1

## 2019-11-29 MED ORDER — TRAMADOL HCL 50 MG PO TABS
50.0000 mg | ORAL_TABLET | Freq: Four times a day (QID) | ORAL | Status: DC | PRN
  Administered 2019-11-30 (×3): 50 mg via ORAL
  Filled 2019-11-29 (×3): qty 1

## 2019-11-29 MED ORDER — MIDAZOLAM HCL 2 MG/2ML IJ SOLN
INTRAMUSCULAR | Status: AC
  Filled 2019-11-29: qty 2

## 2019-11-29 MED ORDER — OXYCODONE HCL 5 MG/5ML PO SOLN: 5.0000 mg | Freq: Once | ORAL | Status: DC | PRN

## 2019-11-29 MED ORDER — DEXAMETHASONE SODIUM PHOSPHATE 10 MG/ML IJ SOLN
INTRAMUSCULAR | Status: AC
  Filled 2019-11-29: qty 1

## 2019-11-29 SURGICAL SUPPLY — 104 items
ADH SKN CLS APL DERMABOND .7 (GAUZE/BANDAGES/DRESSINGS) ×1
BLADE EXTENDED COATED 6.5IN (ELECTRODE) IMPLANT
CANNULA REDUC XI 12-8 STAPL (CANNULA) ×2
CANNULA REDUC XI 12-8MM STAPL (CANNULA) ×1
CANNULA REDUCER 12-8 DVNC XI (CANNULA) IMPLANT
CELLS DAT CNTRL 66122 CELL SVR (MISCELLANEOUS) ×1 IMPLANT
COVER SURGICAL LIGHT HANDLE (MISCELLANEOUS) ×3 IMPLANT
COVER TIP SHEARS 8 DVNC (MISCELLANEOUS) IMPLANT
COVER TIP SHEARS 8MM DA VINCI (MISCELLANEOUS) ×3
COVER WAND RF STERILE (DRAPES) ×3 IMPLANT
DECANTER SPIKE VIAL GLASS SM (MISCELLANEOUS) IMPLANT
DERMABOND ADVANCED (GAUZE/BANDAGES/DRESSINGS) ×2
DERMABOND ADVANCED .7 DNX12 (GAUZE/BANDAGES/DRESSINGS) IMPLANT
DRAIN CHANNEL 19F RND (DRAIN) IMPLANT
DRAPE ARM DVNC X/XI (DISPOSABLE) ×4 IMPLANT
DRAPE CARDIOVASC SPLIT 84X147 (DRAPES) ×2 IMPLANT
DRAPE CARDIOVASC SPLIT 88X140 (DRAPES) ×2 IMPLANT
DRAPE COLUMN DVNC XI (DISPOSABLE) ×1 IMPLANT
DRAPE DA VINCI XI ARM (DISPOSABLE) ×12
DRAPE DA VINCI XI COLUMN (DISPOSABLE) ×3
DRSG OPSITE POSTOP 4X10 (GAUZE/BANDAGES/DRESSINGS) IMPLANT
DRSG OPSITE POSTOP 4X6 (GAUZE/BANDAGES/DRESSINGS) ×2 IMPLANT
DRSG OPSITE POSTOP 4X8 (GAUZE/BANDAGES/DRESSINGS) IMPLANT
ELECT PENCIL ROCKER SW 15FT (MISCELLANEOUS) IMPLANT
ELECT REM PT RETURN 15FT ADLT (MISCELLANEOUS) ×3 IMPLANT
ENDOLOOP SUT PDS II  0 18 (SUTURE)
ENDOLOOP SUT PDS II 0 18 (SUTURE) IMPLANT
EVACUATOR SILICONE 100CC (DRAIN) IMPLANT
GLOVE BIO SURGEON STRL SZ 6 (GLOVE) ×4 IMPLANT
GLOVE BIO SURGEON STRL SZ 6.5 (GLOVE) ×4 IMPLANT
GLOVE BIO SURGEONS STRL SZ 6.5 (GLOVE) ×2
GLOVE BIOGEL PI IND STRL 7.0 (GLOVE) ×2 IMPLANT
GLOVE BIOGEL PI INDICATOR 7.0 (GLOVE) ×8
GLOVE ECLIPSE 6.5 STRL STRAW (GLOVE) ×4 IMPLANT
GLOVE INDICATOR 6.5 STRL GRN (GLOVE) ×4 IMPLANT
GLOVE SURG SS PI 7.0 STRL IVOR (GLOVE) ×4 IMPLANT
GOWN STRL REUS W/TWL XL LVL3 (GOWN DISPOSABLE) ×10 IMPLANT
GRASPER SUT TROCAR 14GX15 (MISCELLANEOUS) IMPLANT
HOLDER FOLEY CATH W/STRAP (MISCELLANEOUS) ×3 IMPLANT
IRRIG SUCT STRYKERFLOW 2 WTIP (MISCELLANEOUS) ×3
IRRIGATION SUCT STRKRFLW 2 WTP (MISCELLANEOUS) ×1 IMPLANT
IRRIGATOR SUCT 8 DISP DVNC XI (IRRIGATION / IRRIGATOR) IMPLANT
IRRIGATOR SUCTION 8MM XI DISP (IRRIGATION / IRRIGATOR)
KIT PROCEDURE DA VINCI SI (MISCELLANEOUS)
KIT PROCEDURE DVNC SI (MISCELLANEOUS) IMPLANT
KIT TURNOVER KIT A (KITS) IMPLANT
NDL INSUFFLATION 14GA 120MM (NEEDLE) ×1 IMPLANT
NEEDLE INSUFFLATION 14GA 120MM (NEEDLE) ×3 IMPLANT
PACK COLON (CUSTOM PROCEDURE TRAY) ×3 IMPLANT
PAD POSITIONING PINK XL (MISCELLANEOUS) ×3 IMPLANT
PORT LAP GEL ALEXIS MED 5-9CM (MISCELLANEOUS) IMPLANT
RELOAD STAPLE 45 BLU REG DVNC (STAPLE) IMPLANT
RELOAD STAPLE 45 GRN THCK DVNC (STAPLE) IMPLANT
RELOAD STAPLE 60 3.5 BLU DVNC (STAPLE) IMPLANT
RELOAD STAPLER 3.5X60 BLU DVNC (STAPLE) ×4 IMPLANT
RETRACTOR WND ALEXIS 18 MED (MISCELLANEOUS) IMPLANT
RTRCTR WOUND ALEXIS 18CM MED (MISCELLANEOUS) ×3
SCISSORS LAP 5X35 DISP (ENDOMECHANICALS) ×2 IMPLANT
SEAL CANN UNIV 5-8 DVNC XI (MISCELLANEOUS) ×3 IMPLANT
SEAL XI 5MM-8MM UNIVERSAL (MISCELLANEOUS) ×9
SEALER VESSEL DA VINCI XI (MISCELLANEOUS) ×3
SEALER VESSEL EXT DVNC XI (MISCELLANEOUS) ×1 IMPLANT
SOLUTION ELECTROLUBE (MISCELLANEOUS) ×3 IMPLANT
STAPLER 45 BLU RELOAD XI (STAPLE) IMPLANT
STAPLER 45 BLUE RELOAD XI (STAPLE)
STAPLER 45 DA VINCI SURE FORM (STAPLE)
STAPLER 45 GREEN RELOAD XI (STAPLE)
STAPLER 45 GRN RELOAD XI (STAPLE) IMPLANT
STAPLER 45 SUREFORM DVNC (STAPLE) IMPLANT
STAPLER 60 DA VINCI SURE FORM (STAPLE) ×3
STAPLER 60 SUREFORM DVNC (STAPLE) IMPLANT
STAPLER CANNULA SEAL DVNC XI (STAPLE) ×1 IMPLANT
STAPLER CANNULA SEAL XI (STAPLE) ×3
STAPLER RELOAD 3.5X60 BLU DVNC (STAPLE) ×4
STAPLER RELOAD 3.5X60 BLUE (STAPLE) ×12
STAPLER VISISTAT 35W (STAPLE) IMPLANT
STOPCOCK 4 WAY LG BORE MALE ST (IV SETS) ×2 IMPLANT
SUT ETHILON 2 0 PS N (SUTURE) IMPLANT
SUT NOVA NAB DX-16 0-1 5-0 T12 (SUTURE) ×6 IMPLANT
SUT PROLENE 2 0 KS (SUTURE) IMPLANT
SUT SILK 2 0 (SUTURE) ×3
SUT SILK 2 0 SH CR/8 (SUTURE) IMPLANT
SUT SILK 2-0 18XBRD TIE 12 (SUTURE) ×1 IMPLANT
SUT SILK 3 0 (SUTURE)
SUT SILK 3 0 SH CR/8 (SUTURE) ×5 IMPLANT
SUT SILK 3-0 18XBRD TIE 12 (SUTURE) IMPLANT
SUT V-LOC BARB 180 2/0GR6 GS22 (SUTURE) ×6
SUT VIC AB 2-0 SH 18 (SUTURE) IMPLANT
SUT VIC AB 2-0 SH 27 (SUTURE)
SUT VIC AB 2-0 SH 27X BRD (SUTURE) IMPLANT
SUT VIC AB 3-0 SH 18 (SUTURE) IMPLANT
SUT VIC AB 4-0 PS2 27 (SUTURE) ×6 IMPLANT
SUT VICRYL 0 UR6 27IN ABS (SUTURE) ×3 IMPLANT
SUTURE V-LC BRB 180 2/0GR6GS22 (SUTURE) IMPLANT
SYR 10ML ECCENTRIC (SYRINGE) ×3 IMPLANT
SYS LAPSCP GELPORT 120MM (MISCELLANEOUS)
SYSTEM LAPSCP GELPORT 120MM (MISCELLANEOUS) IMPLANT
TOWEL OR 17X26 10 PK STRL BLUE (TOWEL DISPOSABLE) IMPLANT
TOWEL OR NON WOVEN STRL DISP B (DISPOSABLE) ×3 IMPLANT
TRAY FOLEY MTR SLVR 14FR STAT (SET/KITS/TRAYS/PACK) ×2 IMPLANT
TROCAR ADV FIXATION 5X100MM (TROCAR) ×3 IMPLANT
TUBING CONNECTING 10 (TUBING) ×2 IMPLANT
TUBING CONNECTING 10' (TUBING) ×1
TUBING INSUFFLATION 10FT LAP (TUBING) ×3 IMPLANT

## 2019-11-29 NOTE — Transfer of Care (Signed)
Immediate Anesthesia Transfer of Care Note  Patient: Kaitlyn Avila  Procedure(s) Performed: XI ROBOT ASSISTED RIGHT COLECTOMY (N/A Abdomen)  Patient Location: PACU  Anesthesia Type:General  Level of Consciousness: drowsy, patient cooperative and responds to stimulation  Airway & Oxygen Therapy: Patient Spontanous Breathing and Patient connected to face mask oxygen  Post-op Assessment: Report given to RN and Post -op Vital signs reviewed and stable  Post vital signs: Reviewed and stable  Last Vitals:  Vitals Value Taken Time  BP 123/69 11/29/19 1551  Temp    Pulse 79 11/29/19 1555  Resp 19 11/29/19 1555  SpO2 100 % 11/29/19 1555  Vitals shown include unvalidated device data.  Last Pain:  Vitals:   11/29/19 1134  TempSrc: Oral         Complications: No apparent anesthesia complications

## 2019-11-29 NOTE — Op Note (Signed)
11/29/2019  3:42 PM  PATIENT:  Kaitlyn Avila  59 y.o. female  Patient Care Team: Cordial, Launa Grill, MD as PCP - General Josue Hector, MD as PCP - Cardiology (Cardiology)  PRE-OPERATIVE DIAGNOSIS:  COLON CANCER, ASCENDING  POST-OPERATIVE DIAGNOSIS:  COLON CANCER, ASCENDING  PROCEDURE:  XI ROBOT ASSISTED RIGHT COLECTOMY   Surgeon(s): Stark Klein, MD Leighton Ruff, MD  ASSISTANT: Dr Barry Dienes   ANESTHESIA:   local and general  EBL: 50 ml  Total I/O In: 1100 [I.V.:1000; IV Piggyback:100] Out: 130 [Urine:80; Blood:50]  Delay start of Pharmacological VTE agent (>24hrs) due to surgical blood loss or risk of bleeding:  no  DRAINS: none   SPECIMEN:  Source of Specimen:  R colon  DISPOSITION OF SPECIMEN:  PATHOLOGY  COUNTS:  YES  PLAN OF CARE: Admit to inpatient   PATIENT DISPOSITION:  PACU - hemodynamically stable.  INDICATION:      I recommended segmental resection:  The anatomy & physiology of the digestive tract was discussed.  The pathophysiology was discussed.  Natural history risks without surgery was discussed.   I worked to give an overview of the disease and the frequent need to have multispecialty involvement.  I feel the risks of no intervention will lead to serious problems that outweigh the operative risks; therefore, I recommended a partial colectomy to remove the pathology.  Laparoscopic & open techniques were discussed.   Risks such as bleeding, infection, abscess, leak, reoperation, possible ostomy, hernia, heart attack, death, and other risks were discussed.  I noted a good likelihood this will help address the problem.   Goals of post-operative recovery were discussed as well.    The patient expressed understanding & wished to proceed with surgery.  OR FINDINGS:   Patient had tattoo noted in the cecum  No obvious metastatic disease on visceral parietal peritoneum or liver.    DESCRIPTION:   Informed consent was confirmed.  The patient underwent  general anaesthesia without difficulty.  The patient was positioned appropriately.  VTE prevention in place.  The patient's abdomen was clipped, prepped, & draped in a sterile fashion.  Surgical timeout confirmed our plan.  The patient was positioned in reverse Trendelenburg.  Abdominal entry was gained using a Varies needle in the LUQ.  Entry was clean.  I induced carbon dioxide insufflation.  An 80mm robotic port was placed in the L lateral space.  Camera inspection revealed no injury.  Extra ports were carefully placed under direct laparoscopic visualization.  I laparoscopically reflected the greater omentum and the upper abdomen the small bowel in the upper abdomen. The patient was appropriately positioned and the robot was docked to the patient's left side.  Instruments were placed under direct visualization.    I began by identifying the ileocolic artery and vein within the mesentery. Dissection was bluntly carried around these structures. The duodenum was identified and free from the structures. I then separated the structures bluntly and used the Enseal device to transect these separately.  I developed the retroperitoneal plane bluntly.  I then freed the appendix off its attachments to the pelvic wall. I mobilized the terminal ileum.  I took care to avoid injuring any retroperitoneal structures.  After this I began to mobilize laterally down the white line of Toldt and then took down the hepatic flexure using the Enseal device. I mobilized the omentum off of the right transverse colon. The entire colon was then flipped medially and mobilized off of the retroperitoneal structures until I could  visualize the lateral edge of the duodenum underneath.  I gently freed the duodenal attachments.  I then skeletonized the mesentery of the transverse colon at the level just proximal to the right branch of the middle colic artery.  This was done using a robotic vessel sealer.  I skeletonized the terminal ileal  mesentery in similar fashion.  I then used a blue load 60 mm robotic stapler to transect the terminal ileum and transverse colon.  The specimen was then freed and placed in the right upper quadrant.  I arranged the small bowel against the transverse colon in a isoperistaltic fashion.  A small enterotomy was made in the small bowel and transverse colon using robotic scissors.  A 60 mm blue load x2 was used to create an anastomosis between the small bowel and the transverse colon robotically.  The common enterotomy channel was then closed using 2, 2-O V lock sutures.  The abdomen was then irrigated with normal saline. The omentum was then brought down over the anastomosis.  The robot was then undocked. A small Pfannenstiel incision was made by enlarging the 12 mm suprapubic port.  I placed an IT sales professional.  The colon was removed from the abdomen and sent to pathology for further examination.  The Cadwell wound protector was removed.  The peritoneum was then closed using a 0 Vicryl running suture.  The fascia was then closed using #0 Novafil interrupted sutures.  The subcutaneous tissue of the extraction incision was closed using interrupted 2-0 Vicryl sutures. The skin was then closed using 4-0 Vicryl sutures. Dermabond was placed on the port sites and a sterile dressing was placed over the abdominal incision. All counts were correct per operating room staff. The patient was then awakened from anesthesia and sent to the post anesthesia care unit in stable condition.   An MD assistant was necessary for tissue manipulation, retraction and positioning due to the complexity of the case and hospital policies

## 2019-11-29 NOTE — Interval H&P Note (Signed)
History and Physical Interval Note:  11/29/2019 12:32 PM  Kaitlyn Avila  has presented today for surgery, with the diagnosis of COLON CANCER, ASCENDING.  The various methods of treatment have been discussed with the patient and family. After consideration of risks, benefits and other options for treatment, the patient has consented to  Procedure(s): XI ROBOT ASSISTED PARTIAL COLECTOMY (N/A) as a surgical intervention.  The patient's history has been reviewed, patient examined, no change in status, stable for surgery.  I have reviewed the patient's chart and labs.  Questions were answered to the patient's satisfaction.     Rosario Adie, MD  [facility] [place] [facility]

## 2019-11-29 NOTE — Anesthesia Preprocedure Evaluation (Addendum)
Anesthesia Evaluation    Airway Mallampati: II  TM Distance: >3 FB Neck ROM: Full    Dental no notable dental hx.    Pulmonary    Pulmonary exam normal breath sounds clear to auscultation       Cardiovascular hypertension, Pt. on medications Normal cardiovascular exam Rhythm:Regular Rate:Normal     Neuro/Psych Depression    GI/Hepatic   Endo/Other  Hypothyroidism   Renal/GU      Musculoskeletal   Abdominal (+) + obese,   Peds  Hematology   Anesthesia Other Findings Colon Cancer  Reproductive/Obstetrics                             Anesthesia Physical Anesthesia Plan  ASA: III  Anesthesia Plan: General   Post-op Pain Management:    Induction: Intravenous  PONV Risk Score and Plan: 3 and Ondansetron, Dexamethasone, Midazolam and Treatment may vary due to age or medical condition  Airway Management Planned: Oral ETT  Additional Equipment:   Intra-op Plan:   Post-operative Plan: Extubation in OR  Informed Consent: I have reviewed the patients History and Physical, chart, labs and discussed the procedure including the risks, benefits and alternatives for the proposed anesthesia with the patient or authorized representative who has indicated his/her understanding and acceptance.     Dental advisory given  Plan Discussed with: CRNA  Anesthesia Plan Comments:         Anesthesia Quick Evaluation

## 2019-11-29 NOTE — Anesthesia Procedure Notes (Signed)
Procedure Name: Intubation Date/Time: 11/29/2019 1:09 PM Performed by: Glory Buff, CRNA Pre-anesthesia Checklist: Patient identified, Emergency Drugs available, Suction available and Patient being monitored Patient Re-evaluated:Patient Re-evaluated prior to induction Oxygen Delivery Method: Circle system utilized Preoxygenation: Pre-oxygenation with 100% oxygen Induction Type: IV induction Ventilation: Mask ventilation without difficulty Laryngoscope Size: Miller and 3 Grade View: Grade I Tube type: Oral Tube size: 7.0 mm Number of attempts: 1 Airway Equipment and Method: Stylet and Oral airway Placement Confirmation: ETT inserted through vocal cords under direct vision,  positive ETCO2 and breath sounds checked- equal and bilateral Secured at: 19 cm Tube secured with: Tape Dental Injury: Teeth and Oropharynx as per pre-operative assessment

## 2019-11-29 NOTE — Anesthesia Postprocedure Evaluation (Signed)
Anesthesia Post Note  Patient: Kaitlyn Avila  Procedure(s) Performed: XI ROBOT ASSISTED RIGHT COLECTOMY (N/A Abdomen)     Patient location during evaluation: PACU Anesthesia Type: General Level of consciousness: awake and alert Pain management: pain level controlled Vital Signs Assessment: post-procedure vital signs reviewed and stable Respiratory status: spontaneous breathing, nonlabored ventilation and respiratory function stable Cardiovascular status: blood pressure returned to baseline and stable Postop Assessment: no apparent nausea or vomiting Anesthetic complications: no    Last Vitals:  Vitals:   11/29/19 1645 11/29/19 1709  BP: 105/70 117/77  Pulse: 71 82  Resp: 15 16  Temp: (!) 36.3 C   SpO2: 100% 100%    Last Pain:  Vitals:   11/29/19 1709  TempSrc: Oral  PainSc:                  Lynda Rainwater

## 2019-11-30 LAB — CBC
HCT: 39.1 % (ref 36.0–46.0)
Hemoglobin: 12.6 g/dL (ref 12.0–15.0)
MCH: 30 pg (ref 26.0–34.0)
MCHC: 32.2 g/dL (ref 30.0–36.0)
MCV: 93.1 fL (ref 80.0–100.0)
Platelets: 246 10*3/uL (ref 150–400)
RBC: 4.2 MIL/uL (ref 3.87–5.11)
RDW: 14.2 % (ref 11.5–15.5)
WBC: 10.1 10*3/uL (ref 4.0–10.5)
nRBC: 0 % (ref 0.0–0.2)

## 2019-11-30 LAB — BASIC METABOLIC PANEL
Anion gap: 9 (ref 5–15)
BUN: 7 mg/dL (ref 6–20)
CO2: 22 mmol/L (ref 22–32)
Calcium: 8.5 mg/dL — ABNORMAL LOW (ref 8.9–10.3)
Chloride: 106 mmol/L (ref 98–111)
Creatinine, Ser: 0.65 mg/dL (ref 0.44–1.00)
GFR calc Af Amer: >60 mL/min (ref >60)
GFR calc non Af Amer: >60 mL/min (ref >60)
Glucose, Bld: 110 mg/dL — ABNORMAL HIGH (ref 70–99)
Potassium: 4 mmol/L (ref 3.5–5.1)
Sodium: 137 mmol/L (ref 135–145)

## 2019-11-30 MED ORDER — TRAMADOL HCL 50 MG PO TABS: 50.0000 mg | ORAL_TABLET | Freq: Four times a day (QID) | ORAL | 0 refills | Status: DC | PRN

## 2019-11-30 NOTE — Discharge Instructions (Addendum)
ABDOMINAL SURGERY: POST OP INSTRUCTIONS  1. DIET: Follow a light bland diet the first 24 hours after arrival home, such as soup, liquids, crackers, etc.  Be sure to include lots of fluids daily.  Avoid fast food or heavy meals as your are more likely to get nauseated.  Do not eat any uncooked fruits or vegetables for the next 2 weeks as your colon heals. 2. Take your usually prescribed home medications unless otherwise directed. 3. PAIN CONTROL: a. Pain is best controlled by a usual combination of three different methods TOGETHER: i. Ice/Heat ii. Over the counter pain medication iii. Prescription pain medication b. Most patients will experience some swelling and bruising around the incisions.  Ice packs or heating pads (30-60 minutes up to 6 times a day) will help. Use ice for the first few days to help decrease swelling and bruising, then switch to heat to help relax tight/sore spots and speed recovery.  Some people prefer to use ice alone, heat alone, alternating between ice & heat.  Experiment to what works for you.  Swelling and bruising can take several weeks to resolve.   c. It is helpful to take an over-the-counter pain medication regularly for the first few weeks.  Choose one of the following that works best for you: i. Naproxen (Aleve, etc)  Two 220mg  tabs twice a day ii. Ibuprofen (Advil, etc) Three 200mg  tabs four times a day (every meal & bedtime) iii. Acetaminophen (Tylenol, etc) 500-650mg  four times a day (every meal & bedtime) d. A  prescription for pain medication (such as oxycodone, hydrocodone, etc) should be given to you upon discharge.  Take your pain medication as prescribed.  i. If you are having problems/concerns with the prescription medicine (does not control pain, nausea, vomiting, rash, itching, etc), please call us [phone] to see if we need to switch you to a different pain medicine that will work better for you and/or control your side effect better. ii. If you  need a refill on your pain medication, please contact your pharmacy.  They will contact our office to request authorization. Prescriptions will not be filled after 5 pm or on week-ends. 4. Avoid getting constipated.  Between the surgery and the pain medications, it is common to experience some constipation.  Increasing fluid intake and taking a fiber supplement (such as Metamucil, Citrucel, FiberCon, MiraLax, etc) 1-2 times a day regularly will usually help prevent this problem from occurring.  A mild laxative (prune juice, Milk of Magnesia, MiraLax, etc) should be taken according to package directions if there are no bowel movements after 48 hours.   5. Watch out for diarrhea.  If you have many loose bowel movements, simplify your diet to bland foods & liquids for a few days.  Stop any stool softeners and decrease your fiber supplement.  Switching to mild anti-diarrheal medications (Kayopectate, Pepto Bismol) can help.  If this worsens or does not improve, please call us. 6. Wash / shower every day.  You may shower over the incision / wound.  Avoid baths until the skin is fully healed.  Continue to shower over incision(s) after the dressing is off. 7. You may remove your lower abdominal bandage on Sunday.  Keep the incision covered with a dry dressing after this.  8. ACTIVITIES as tolerated:   a. You may resume regular (light) daily activities beginning the next day--such as daily self-care, walking, climbing stairs--gradually increasing activities as tolerated.  If you can walk 30 minutes without difficulty, it  is safe to try more intense activity such as jogging, treadmill, bicycling, low-impact aerobics, swimming, etc. b. Save the most intensive and strenuous activity for last such as sit-ups, heavy lifting, contact sports, etc  Refrain from any heavy lifting or straining until you are off narcotics for pain control.   c. DO NOT PUSH THROUGH PAIN.  Let pain be your guide: If it hurts to do something,  don't do it.  Pain is your body warning you to avoid that activity for another week until the pain goes down. d. You may drive when you are no longer taking prescription pain medication, you can comfortably wear a seatbelt, and you can safely maneuver your car and apply brakes. e. Dennis Bast may have sexual intercourse when it is comfortable.  9. FOLLOW UP in our office a. Please call [facility] at [phone] to set up an appointment to see your surgeon in the office for a follow-up appointment approximately 1-2 weeks after your surgery. b. Make sure that you call for this appointment the day you arrive home to insure a convenient appointment time. 10. IF YOU HAVE DISABILITY OR FAMILY LEAVE FORMS, BRING THEM TO THE OFFICE FOR PROCESSING.  DO NOT GIVE THEM TO YOUR DOCTOR.   WHEN TO CALL us [phone]: 1. Poor pain control 2. Reactions / problems with new medications (rash/itching, nausea, etc)  3. Fever over 101.5 F (38.5 C) 4. Inability to urinate 5. Nausea and/or vomiting 6. Worsening swelling or bruising 7. Continued bleeding from incision. 8. Increased pain, redness, or drainage from the incision  The clinic staff is available to answer your questions during regular business hours (8:30am-5pm).  Please don't hesitate to call and ask to speak to one of our nurses for clinical concerns.   A surgeon from [facility] is always on call at the hospitals   If you have a medical emergency, go to the nearest emergency room or call 911.    [facility], [place], [place], [place], [place]  [address] ? MAIN: [phone] ? TOLL FREE: [phone] ? FAX [phone] [email or web address]

## 2019-11-30 NOTE — Progress Notes (Signed)
1 Day Post-Op Robotic R colectomy Subjective: Pain controlled.  Tolerating liquids.  No BM or Flatus yet.  Ambulated in hall.  No nausea  Objective: Vital signs in last 24 hours: Temp:  [97.4 F (36.3 C)-98.5 F (36.9 C)] 98.2 F (36.8 C) (03/26 0504) Pulse Rate:  [70-82] 78 (03/26 0504) Resp:  [13-19] 17 (03/26 0504) BP: (96-137)/(64-89) 123/71 (03/26 0504) SpO2:  [97 %-100 %] 97 % (03/26 0504) Weight:  [99 kg] 99 kg (03/26 0642)   Intake/Output from previous day: 03/25 0701 - 03/26 0700 In: 2240.7 [P.O.:80; I.V.:2060.7; IV Piggyback:100] Out: 530 [Urine:480; Blood:50] Intake/Output this shift: Total I/O In: 240 [P.O.:240] Out: 300 [Urine:300]   General appearance: alert and cooperative GI: normal findings: soft, appropriately tender  Incision: no significant drainage  Lab Results:  Recent Labs    11/30/19 0500  WBC 10.1  HGB 12.6  HCT 39.1  PLT 246   BMET Recent Labs    11/30/19 0500  NA 137  K 4.0  CL 106  CO2 22  GLUCOSE 110*  BUN 7  CREATININE 0.65  CALCIUM 8.5*   PT/INR No results for input(s): LABPROT, INR in the last 72 hours. ABG No results for input(s): PHART, HCO3 in the last 72 hours.  Invalid input(s): PCO2, PO2  MEDS, Scheduled . acetaminophen  1,000 mg Oral Q6H  . alvimopan  12 mg Oral BID  . enoxaparin (LOVENOX) injection  40 mg Subcutaneous Q24H  . feeding supplement  237 mL Oral BID BM  . FLUoxetine  20 mg Oral QHS  . gabapentin  300 mg Oral BID  . levothyroxine  112 mcg Oral QHS  . saccharomyces boulardii  250 mg Oral BID    Studies/Results: No results found.  Assessment: s/p Procedure(s): XI ROBOT ASSISTED RIGHT COLECTOMY Patient Active Problem List   Diagnosis Date Noted  . Colon cancer ([place]) 11/29/2019  . Routine general medical examination at a health care facility 02/11/2013  . Other and unspecified hyperlipidemia 02/11/2013  . Obesity, unspecified 02/11/2013  . Other malaise and fatigue 02/11/2013  .  Essential hypertension, benign 02/11/2013  . HYPOTHYROIDISM 11/30/2007  . DEPRESSION 11/30/2007    Expected post op course  Plan: d/c foley  SL IVF's Ambulate Full liquids, advance as tolerated Plan for d/c over the weekend.  Will f/u by phone next week with path results.   LOS: 1 day     .Rosario Adie, MD [facility], [place]    11/30/2019 9:18 AM

## 2019-12-01 LAB — BASIC METABOLIC PANEL
Anion gap: 7 (ref 5–15)
BUN: 8 mg/dL (ref 6–20)
CO2: 28 mmol/L (ref 22–32)
Calcium: 8.6 mg/dL — ABNORMAL LOW (ref 8.9–10.3)
Chloride: 106 mmol/L (ref 98–111)
Creatinine, Ser: 0.77 mg/dL (ref 0.44–1.00)
GFR calc Af Amer: >60 mL/min (ref >60)
GFR calc non Af Amer: >60 mL/min (ref >60)
Glucose, Bld: 97 mg/dL (ref 70–99)
Potassium: 4.1 mmol/L (ref 3.5–5.1)
Sodium: 141 mmol/L (ref 135–145)

## 2019-12-01 LAB — CBC
HCT: 36.7 % (ref 36.0–46.0)
Hemoglobin: 11.6 g/dL — ABNORMAL LOW (ref 12.0–15.0)
MCH: 29.7 pg (ref 26.0–34.0)
MCHC: 31.6 g/dL (ref 30.0–36.0)
MCV: 93.9 fL (ref 80.0–100.0)
Platelets: 221 K/uL (ref 150–400)
RBC: 3.91 MIL/uL (ref 3.87–5.11)
RDW: 14.6 % (ref 11.5–15.5)
WBC: 8 K/uL (ref 4.0–10.5)
nRBC: 0 % (ref 0.0–0.2)

## 2019-12-01 NOTE — Progress Notes (Signed)
Discharge orders discussed with patient and family, verbalized agreement and understanding 

## 2019-12-01 NOTE — Discharge Summary (Signed)
Physician Discharge Summary  Patient ID: NYLIAH REPKO MRN: [id number] DOB/AGE: 1960/12/15 59 y.o.  Admit date: 11/29/2019 Discharge date: 12/01/2019  Admission Diagnoses:  Discharge Diagnoses:  Active Problems:   Colon cancer [facility])   Discharged Condition: good  Hospital Course: She was admitted for observation and supportive care following robotic right colectomy.  By the morning of postop day 2 she was ambulating well, tolerating a diet, and having bowel movements.  Her labs were within normal limits.  Her pain was well controlled on oral medications.  She was deemed stable for discharge home.  Consults: None  Discharge Exam: Blood pressure 130/79, pulse 80, temperature 98.5 F (36.9 C), temperature source Oral, resp. rate 16, weight 99.5 kg, SpO2 98 %. General appearance: alert and cooperative Resp: Unlabored Cardio: regular rate and rhythm GI: Soft, minimally appropriately tender, nondistended.  Incisions clean dry intact without cellulitis or hematoma Neurologic: Grossly normal  Disposition: Discharge disposition: 01-Home or Self Care        Allergies as of 12/01/2019      Reactions   Iodinated Diagnostic Agents Itching   Prednisone Anaphylaxis   Erythromycin Base Rash      Medication List    TAKE these medications   BLACK ELDERBERRY PO Take 1 tablet by mouth daily.   FLUoxetine 20 MG capsule Commonly known as: PROZAC Take 20 mg by mouth at bedtime.   levothyroxine 112 MCG tablet Commonly known as: SYNTHROID Take 112 mcg by mouth at bedtime.   Multivitamin Gummies Womens Google 1 tablet by mouth daily.   traMADol 50 MG tablet Commonly known as: ULTRAM Take 1 tablet (50 mg total) by mouth every 6 (six) hours as needed (mild pain).      Follow-up Information    Leighton Ruff, MD Follow up.   Specialty: General Surgery Why: Dr. Marcello Moores will contact you by phone in the next 1 to 2 weeks Contact information: [address] [place] [address] [phone]           Signed: Clovis Riley 12/01/2019, 8:18 AM

## 2019-12-04 LAB — SURGICAL PATHOLOGY

## 2020-05-26 ENCOUNTER — Other Ambulatory Visit: Payer: Self-pay | Admitting: Plastic Surgery

## 2020-10-10 ENCOUNTER — Encounter: Payer: Self-pay | Admitting: Gastroenterology

## 2020-10-18 IMAGING — CT CT CHEST W/O CM
2 of 4 series · 15 of 36 positions shown, 18 images · non-contrast
Comparison: None.

CLINICAL DATA: Colon cancer. Cough.

EXAM:
CT CHEST WITHOUT CONTRAST
TECHNIQUE: Multidetector CT imaging of the chest was performed following the
standard protocol without IV contrast.

[Series 2: thorax · axial · 0.66mm/px · z∈[-289,-27]mm · 12 of 155 slices shown, 15 images]
[im 12/155  mediastinal]
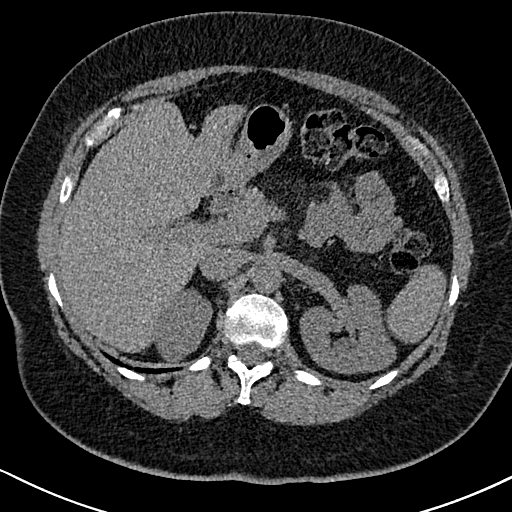
[im 12/155  lung]
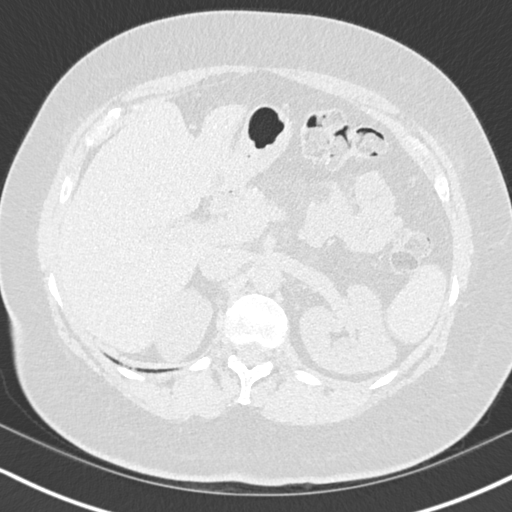
[im 24/155  lung]
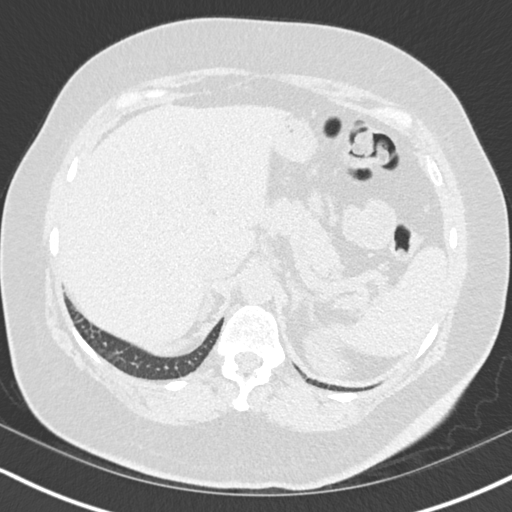
[im 36/155  lung]
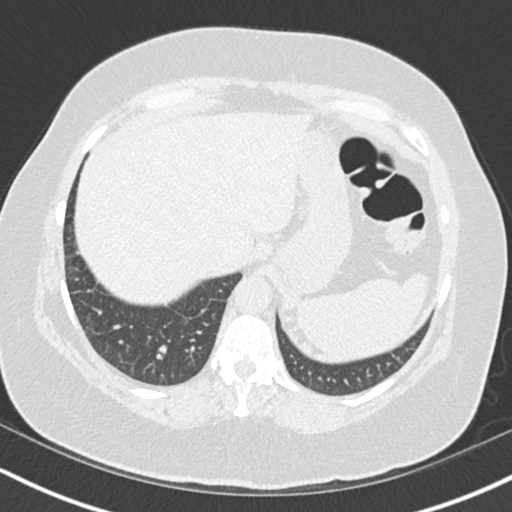
[im 48/155  lung]
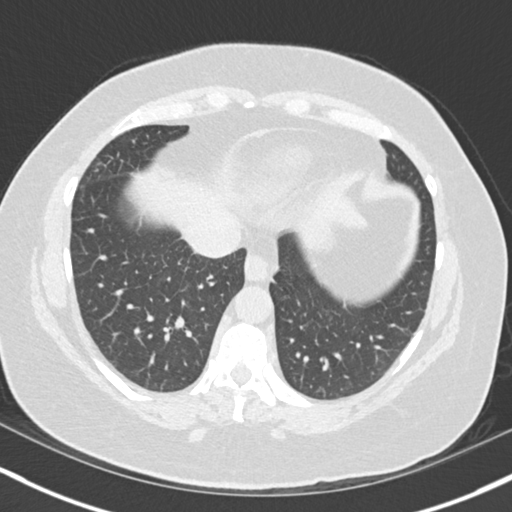
[im 60/155  mediastinal]
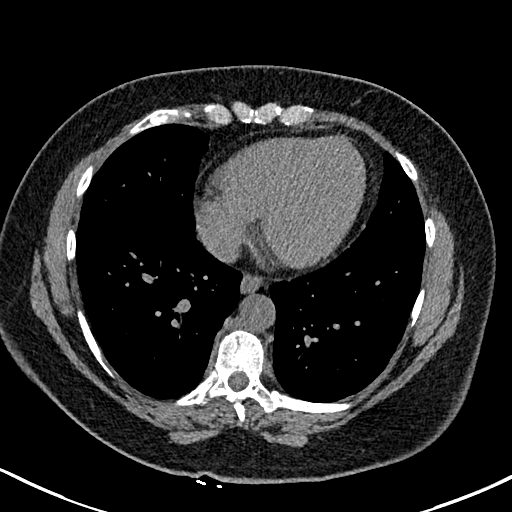
[im 60/155  lung]
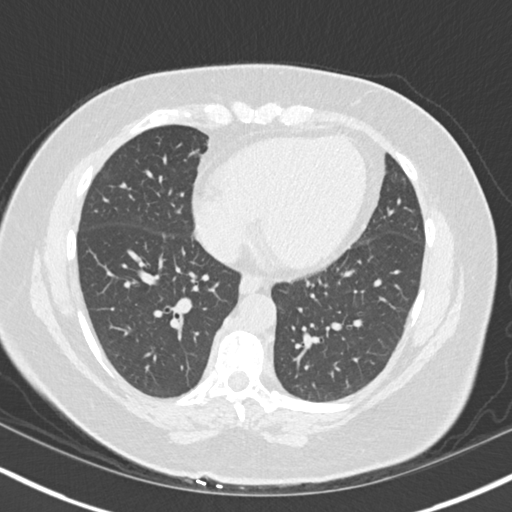
[im 72/155  lung]
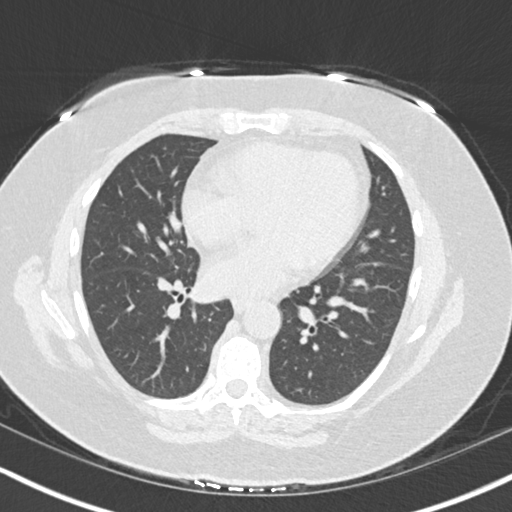
[im 83/155  lung]
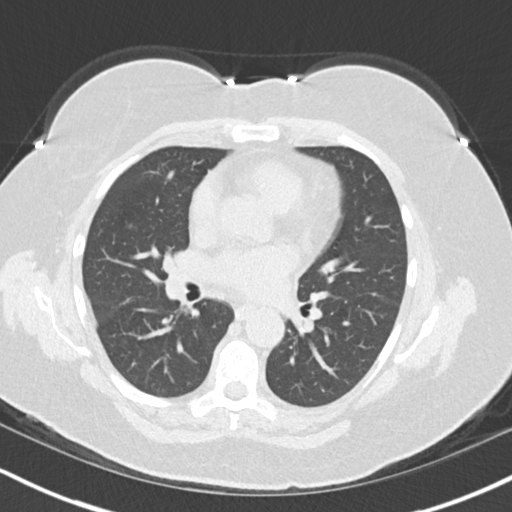
[im 95/155  lung]
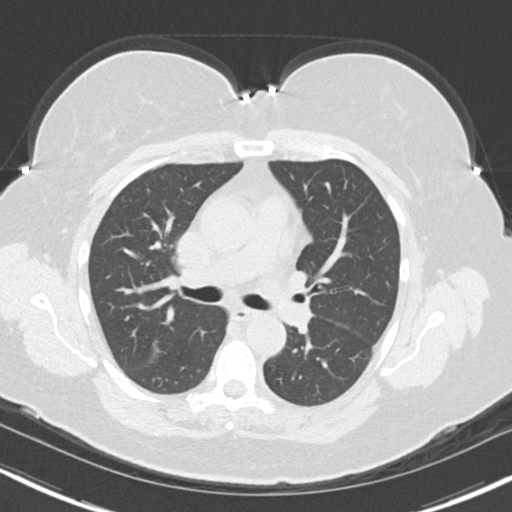
[im 107/155  mediastinal]
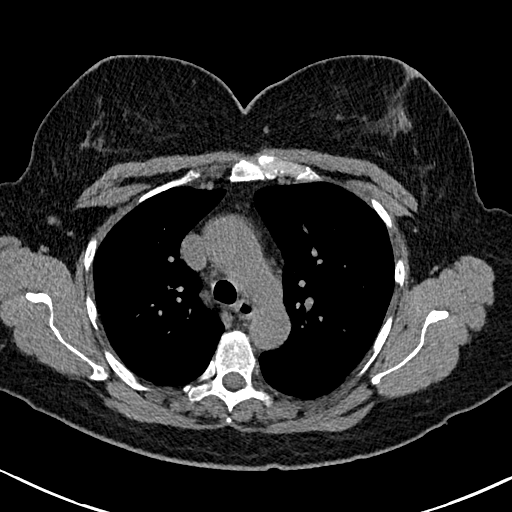
[im 107/155  lung]
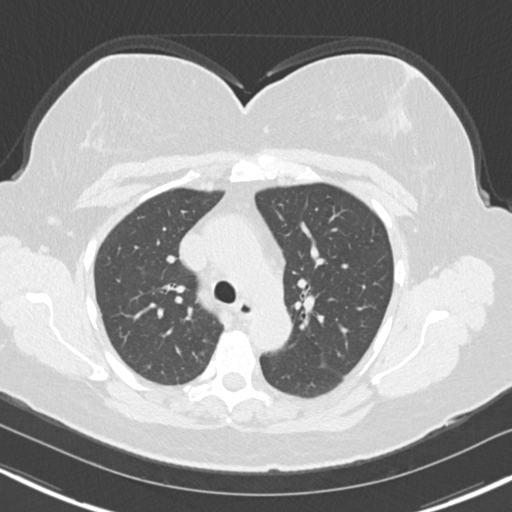
[im 119/155  lung]
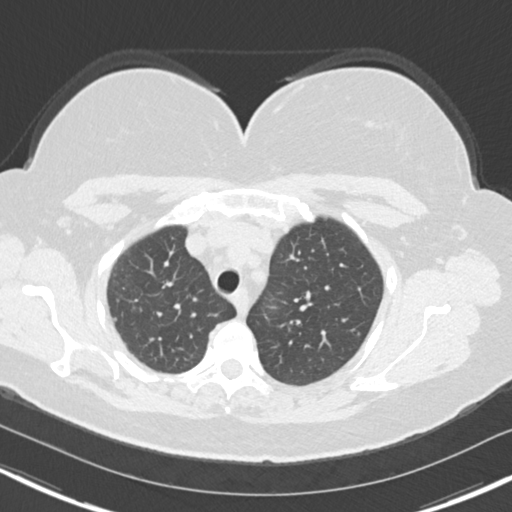
[im 131/155  lung]
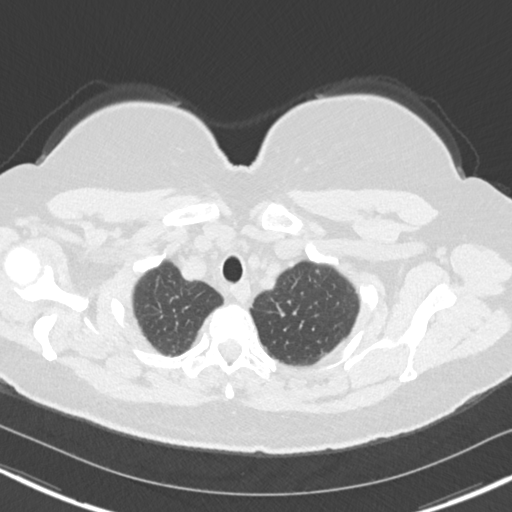
[im 143/155  lung]
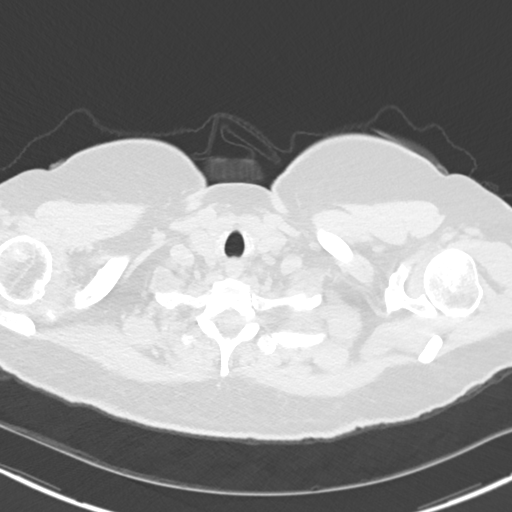

[Series 5: coronal · coronal · 0.61mm/px · 3 of 136 slices shown]
[im 28/136  lung]
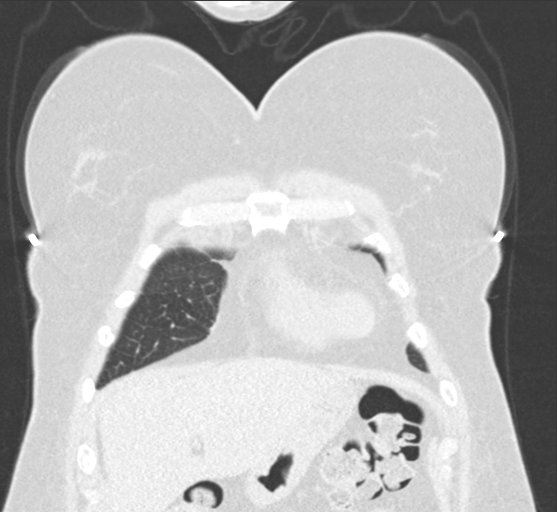
[im 55/136  lung]
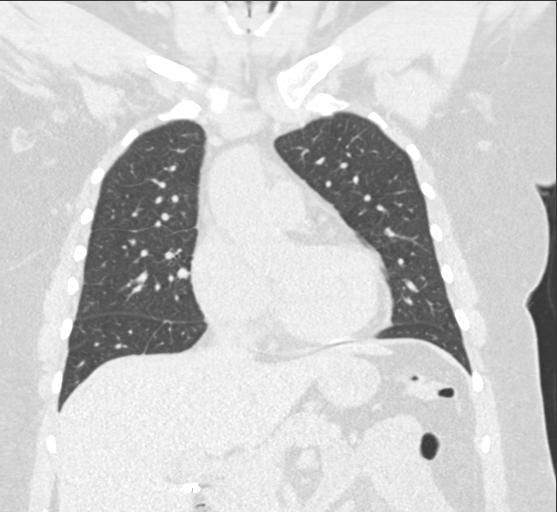
[im 82/136  lung]
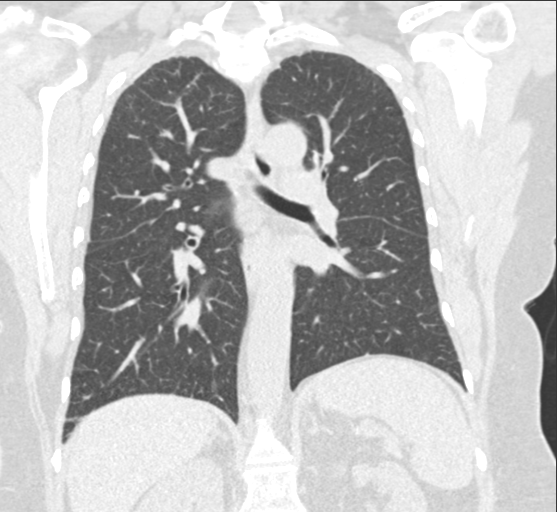

[15 of 36 positions shown; findings below may reference images not displayed]

FINDINGS: Cardiovascular: The heart size is normal. No substantial pericardial
effusion. Coronary artery calcification is evident.

Mediastinum/Nodes: No mediastinal lymphadenopathy. 10 mm short axis
subcarinal lymph node is upper normal for size. No evidence for
gross hilar lymphadenopathy although assessment is limited by the
lack of intravenous contrast on today's study. The esophagus has
normal imaging features. There is no axillary lymphadenopathy.

Lungs/Pleura: 3 mm right middle lobe pulmonary nodule identified on
image 74/7. No other suspicious pulmonary nodule or mass. No focal
airspace consolidation. No pleural effusion.

Upper Abdomen: Cholecystectomy. Otherwise unremarkable.

Musculoskeletal: No worrisome lytic or sclerotic osseous
abnormality.
IMPRESSION: 3 mm right middle lobe pulmonary nodule. This is most likely benign
but given reported clinical history, surveillance likely warranted
to ensure stability.

No focal airspace consolidation or pleural effusion.

## 2020-11-19 ENCOUNTER — Other Ambulatory Visit (HOSPITAL_COMMUNITY): Payer: Self-pay | Admitting: Student

## 2020-11-19 DIAGNOSIS — R109 Unspecified abdominal pain: Secondary | ICD-10-CM

## 2020-11-21 ENCOUNTER — Ambulatory Visit (HOSPITAL_COMMUNITY): Payer: Commercial Managed Care - PPO

## 2020-11-24 ENCOUNTER — Ambulatory Visit (HOSPITAL_COMMUNITY)
Admission: RE | Admit: 2020-11-24 | Discharge: 2020-11-24 | Disposition: A | Discharge status: Home / Self Care | Payer: Commercial Managed Care - PPO | Source: Ambulatory Visit | Attending: Student | Admitting: Student

## 2020-11-24 ENCOUNTER — Encounter (HOSPITAL_COMMUNITY): Payer: Self-pay

## 2020-11-24 ENCOUNTER — Other Ambulatory Visit: Payer: Self-pay

## 2020-11-24 DIAGNOSIS — R109 Unspecified abdominal pain: Secondary | ICD-10-CM

## 2020-11-25 ENCOUNTER — Other Ambulatory Visit (HOSPITAL_COMMUNITY): Payer: Self-pay | Admitting: Student

## 2020-11-25 DIAGNOSIS — R109 Unspecified abdominal pain: Secondary | ICD-10-CM

## 2020-11-27 ENCOUNTER — Other Ambulatory Visit: Payer: Self-pay

## 2020-11-27 ENCOUNTER — Ambulatory Visit (HOSPITAL_COMMUNITY)
Admission: RE | Admit: 2020-11-27 | Discharge: 2020-11-27 | Disposition: A | Discharge status: Home / Self Care | Payer: Commercial Managed Care - PPO | Source: Ambulatory Visit | Attending: Student | Admitting: Student

## 2020-11-27 DIAGNOSIS — R109 Unspecified abdominal pain: Secondary | ICD-10-CM | POA: Diagnosis not present

## 2020-12-05 ENCOUNTER — Ambulatory Visit: Payer: Commercial Managed Care - PPO | Admitting: Nurse Practitioner

## 2020-12-09 ENCOUNTER — Ambulatory Visit: Payer: Commercial Managed Care - PPO | Admitting: Nurse Practitioner

## 2020-12-09 ENCOUNTER — Other Ambulatory Visit: Payer: Self-pay

## 2020-12-09 ENCOUNTER — Encounter: Payer: Self-pay | Admitting: Nurse Practitioner

## 2020-12-09 VITALS — BP 134/86 | HR 76 | Ht 62.0 in | Wt 221.4 lb

## 2020-12-09 DIAGNOSIS — R194 Change in bowel habit: Secondary | ICD-10-CM | POA: Diagnosis not present

## 2020-12-09 DIAGNOSIS — Z85038 Personal history of other malignant neoplasm of large intestine: Secondary | ICD-10-CM | POA: Diagnosis not present

## 2020-12-09 DIAGNOSIS — C801 Malignant (primary) neoplasm, unspecified: Secondary | ICD-10-CM | POA: Diagnosis not present

## 2020-12-09 DIAGNOSIS — R1032 Left lower quadrant pain: Secondary | ICD-10-CM

## 2020-12-09 MED ORDER — SUPREP BOWEL PREP KIT 17.5-3.13-1.6 GM/177ML PO SOLN: 1.0000 | ORAL | 0 refills | Status: DC

## 2020-12-09 MED ORDER — DICYCLOMINE HCL 10 MG PO CAPS: 10.0000 mg | ORAL_CAPSULE | Freq: Two times a day (BID) | ORAL | 0 refills | Status: DC

## 2020-12-09 NOTE — Progress Notes (Signed)
Reviewed and agree with management plan. 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 It was noted on MR in 11/2019 and it was asymptomatic at that time.  This is an uncommon condition and therefore recommend further evaluation and treatment at a tertiary center.  Proceed with colonoscopy as planned for follow up of colon cancer.   Pricilla Riffle. Fuller Plan, MD FACG [phone]

## 2020-12-09 NOTE — Progress Notes (Signed)
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 She is  s/p segmental resection by Dr. Marcello Moores in  March 2022 --Patient is due for one year surveillance colonoscopy.The risks and benefits of colonoscopy with possible polypectomy / biopsies were discussed and the patient agrees to proceed.   # Several week history of bloating / nausea / LLQ pain / bowel changes with loose to now soft, narrow caliber stool. Pain worse after eating but also has some discomfort with bending / twisting. Etiology of symptoms unclear. Initially symptoms could have been infectious however non -contrast CT scan shows nonspecific haziness of the mesentery which could represent mesenteric panniculitis. Also right anterior pelvic fat containing hernia  ( suspect incidental finding) --I will ask Dr. Fuller Plan to review CT scan.   --We discussed that mesenteric panniculitis involves the fatty cells of the mesentery.  No evidence of inflammation in small bowel or the colon on CT scan.  No evidence for diverticular disease --LLQ pain has improved but not resolved with diclofenac prescribed by PCP last week. I am going to add a low-dose of Bentyl twice daily.  If proves to be without benefit then discontinue it after a few days. --Further evaluation of symptoms at time of colonoscopy.   -- I think she has an upcoming appointment with [facility] and they can discuss the hernia with her.   HISTORY OF PRESENT ILLNESS    Chief Complaint : bloating, lower abdominal pain  Kaitlyn Avila is a 60 y.o. female known to Dr. Fuller Plan with a past medical history of appendectomy, colon cancer at [facility], hypertension, hyperlipidemia, hypothyroidism, obesity  10/13/19 Office visit with Dr. Fuller Plan for positive FIT. Colonoscopy showed infilrating mass at [facility] and biopsies positive for adenocarcinoma. She underwent a segmention resection in March 2021.   Patient comes in with a 4 week  history of intermittent bloating, nausea and dull, crampy LLQ pain.  Eating definitely exacerbates the pain.  To some degree bending and twisting also makes things worse.  Episodes may last anywhere from 30 minutes to 2 hours. Pain is not relieved with defecation.  No associated urinary symptoms.  She has not run a fever.  When the pain and bloating first started her stools were loose and more frequent.  No antibiotics use in the few months preceding onset of these symptoms. Over the couple of few weeks her stools having changed to being soft but narrow in caliber.  Last week PCP prescribed Diclofenac and it is helping but hasn't resolved the pain.  Recent CBC and CMP unremarkable (patient was able to pull up recent labs on phone)   DATA REVIEWED:  11/27/20 Noncontrast CT scan for bloating and abdominal pain ( ordered by [facility]) She has IV contrast allergy.  1. No bowel obstruction. 2. No hydronephrosis or nephrolithiasis. 3. Right anterior pelvic fat containing hernia. 4. Nonspecific haziness of the mesentery may represent mesenteric panniculitis . 5. Aortic Atherosclerosis (ICD10-I70.0).  12/01/20 CBC looked fine WBC 6.3, Alk phos 138 , remainder of liver tests were  Normal. hgb 14.8 TSH 07.   PREVIOUS ENDOSCOPIC EVALUATIONS / PERTINENT STUDIES:   Feb 2021 Colonoscopy for positive FIT  The perianal and digital rectal examinations were normal. - An infiltrative non-obstructing small mass was found at the ileocecal valve. The mass was non-circumferential. The mass measured one cm in length. In addition, its diameter measured 1 cm. No bleeding was present.  Biopsies were taken with a cold forceps for histology. Area was tattooed with an injection of 3 mL of Spot (carbon black). - A 8 mm polyp was found in the descending colon. The polyp was semi-pedunculated. The polyp was removed with a cold snare. Resection and retrieval were complete. - The exam was otherwise without abnormality on direct and  retroflexion views.   Surgical [P], ileocecal valve - ADENOCARCINOMA. - FRAGMENT OF TUBULAR ADENOMA WITH HIGH-GRADE DYSPLASIA. - TWO FRAGMENTS OF TUBULAR ADENOMA. 2. Surgical [P], descending, polyp - TUBULAR ADENOMA. - NO HIGH GRADE DYSPLASIA OR MALIGNANCY.  Past Medical History:  Diagnosis Date  . Abnormal weight gain 11/30/2007  . Blood clot in vein    right leg  . Colon cancer ([place])   . DEPRESSION 11/30/2007   Qualifier: Diagnosis of  By: Loanne Drilling MD, Sean A   . Essential hypertension, benign 02/11/2013  . Hyperlipidemia   . Hypertension   . Hypothyroidism   . Obesity, unspecified 02/11/2013  . Other and unspecified hyperlipidemia 02/11/2013  . Other malaise and fatigue 02/11/2013  . PMS (premenstrual syndrome)   . Routine general medical examination at a health care facility 02/11/2013    Current Medications, Allergies, Past Surgical History, Family History and Social History were reviewed in [employer] record.   Current Outpatient Medications  Medication Sig Dispense Refill  . Cholecalciferol (VITAMIN D3 PO) Take 1 tablet by mouth daily.    . cyclobenzaprine (FLEXERIL) 10 MG tablet Take 1 tablet by mouth at bedtime.    Marland Kitchen FLUoxetine (PROZAC) 20 MG capsule Take 20 mg by mouth at bedtime.     Marland Kitchen levothyroxine (SYNTHROID) 112 MCG tablet Take 112 mcg by mouth at bedtime.     . Multiple Vitamins-Minerals (MULTIVITAMIN GUMMIES WOMENS) CHEW Chew 1 tablet by mouth daily.      No current facility-administered medications for this visit.    Review of Systems: No chest pain. No shortness of breath. No urinary complaints.   PHYSICAL EXAM :    Wt Readings from Last 3 Encounters:  12/09/20 221 lb 6 oz (100.4 kg)  12/01/19 219 lb 5.7 oz (99.5 kg)  11/26/19 214 lb (97.1 kg)    BP 134/86 (BP Location: Left Arm, Patient Position: Sitting, Cuff Size: Normal)   Pulse 76   Ht '5\' 2"'  (1.575 m) Comment: height measured without shoes  Wt 221 lb 6 oz (100.4 kg)   LMP   (LMP Unknown)   BMI 40.49 kg/m  Constitutional:  Pleasant female in no acute distress. Psychiatric: Normal mood and affect. Behavior is normal. EENT: Pupils normal.  Conjunctivae are normal. No scleral icterus. Neck supple.  Cardiovascular: Normal rate, regular rhythm. No edema Pulmonary/chest: Effort normal and breath sounds normal. No wheezing, rales or rhonchi. Abdominal: Soft, nondistended, mild left mid abdominal tenderness. Bowel sounds active throughout. There are no masses palpable. No hepatomegaly. Neurological: Alert and oriented to person place and time. Skin: Skin is warm and dry. No rashes noted.  Tye Savoy, NP  12/09/2020, 10:26 AM

## 2020-12-09 NOTE — Patient Instructions (Addendum)
If you are age 58 or younger, your body mass index should be between 19-25. Your Body mass index is 40.49 kg/m. If this is out of the aformentioned range listed, please consider follow up with your Primary Care Provider.   PROCEDURES: You have been scheduled for a colonoscopy. Please follow the written instructions given to you at your visit today. Please pick up your prep supplies at the pharmacy within the next 1-3 days. If you use inhalers (even only as needed), please bring them with you on the day of your procedure.  MEDICATION: We have sent the following medication to your pharmacy for you to pick up at your convenience: Dicyclomine 10 MG twice a day.If no improvement in 2-3 days then stop taking.   It was great seeing you today! Thank you for entrusting me with your care and choosing [facility].  Tye Savoy, NP

## 2020-12-16 ENCOUNTER — Encounter: Payer: Self-pay | Admitting: Gastroenterology

## 2020-12-16 ENCOUNTER — Other Ambulatory Visit: Payer: Self-pay

## 2020-12-16 ENCOUNTER — Ambulatory Visit (AMBULATORY_SURGERY_CENTER): Payer: Commercial Managed Care - PPO | Admitting: Gastroenterology

## 2020-12-16 VITALS — BP 147/81 | HR 68 | Temp 97.9°F | Resp 14 | Ht 62.0 in | Wt 221.0 lb

## 2020-12-16 DIAGNOSIS — K635 Polyp of colon: Secondary | ICD-10-CM | POA: Diagnosis not present

## 2020-12-16 DIAGNOSIS — D125 Benign neoplasm of sigmoid colon: Secondary | ICD-10-CM

## 2020-12-16 DIAGNOSIS — R1032 Left lower quadrant pain: Secondary | ICD-10-CM

## 2020-12-16 DIAGNOSIS — R194 Change in bowel habit: Secondary | ICD-10-CM

## 2020-12-16 DIAGNOSIS — C801 Malignant (primary) neoplasm, unspecified: Secondary | ICD-10-CM

## 2020-12-16 DIAGNOSIS — Z85038 Personal history of other malignant neoplasm of large intestine: Secondary | ICD-10-CM

## 2020-12-16 MED ORDER — DICYCLOMINE HCL 10 MG PO CAPS: 10.0000 mg | ORAL_CAPSULE | Freq: Three times a day (TID) | ORAL | 15 refills | Status: AC

## 2020-12-16 MED ORDER — SODIUM CHLORIDE 0.9 % IV SOLN: 500.0000 mL | Freq: Once | INTRAVENOUS | Status: DC

## 2020-12-16 NOTE — Progress Notes (Signed)
Called to room to assist during endoscopic procedure.  Patient ID and intended procedure confirmed with present staff. Received instructions for my participation in the procedure from the performing physician.  

## 2020-12-16 NOTE — Progress Notes (Signed)
Vitals-CW  History reviewed. 

## 2020-12-16 NOTE — Op Note (Signed)
[place] Patient Name: Kaitlyn Avila Procedure Date: 12/16/2020 11:38 AM MRN: [id number] Endoscopist: Ladene Artist , MD Age: 60 Referring MD:  Date of Birth: 02/28/1961 Gender: Female Account #: [id number] Procedure:                Colonoscopy Indications:              High risk colon cancer surveillance: Personal                            history of colon cancer. Incendental change in                            bowel habits and LLQ pain. Medicines:                Monitored Anesthesia Care Procedure:                Pre-Anesthesia Assessment:                           - Prior to the procedure, a History and Physical                            was performed, and patient medications and                            allergies were reviewed. The patient's tolerance of                            previous anesthesia was also reviewed. The risks                            and benefits of the procedure and the sedation                            options and risks were discussed with the patient.                            All questions were answered, and informed consent                            was obtained. Prior Anticoagulants: The patient has                            taken no previous anticoagulant or antiplatelet                            agents. ASA Grade Assessment: III - A patient with                            severe systemic disease. After reviewing the risks                            and benefits, the patient was deemed in  satisfactory condition to undergo the procedure.                           After obtaining informed consent, the colonoscope                            was passed under direct vision. Throughout the                            procedure, the patient's blood pressure, pulse, and                            oxygen saturations were monitored continuously. The                            Olympus CF-HQ190L [id number])  Colonoscope was                            introduced through the anus and advanced to the the                            ileocolonic anastomosis. The rectum was                            photographed. The quality of the bowel preparation                            was good. The colonoscopy was performed without                            difficulty. The patient tolerated the procedure                            well. Scope In: 11:54:12 AM Scope Out: 12:07:49 PM Scope Withdrawal Time: 0 hours 9 minutes 32 seconds  Total Procedure Duration: 0 hours 13 minutes 37 seconds  Findings:                 The perianal and digital rectal examinations were                            normal.                           There was evidence of a prior end-to-side                            ileo-colonic anastomosis in the ascending colon.                            This was patent and was characterized by healthy                            appearing mucosa. The anastomosis was not traversed.  A 5 mm polyp was found in the sigmoid colon. The                            polyp was sessile. The polyp was removed with a                            cold snare. Resection and retrieval were complete.                           The exam was otherwise without abnormality on                            direct and retroflexion views. Complications:            No immediate complications. Estimated blood loss:                            None. Estimated Blood Loss:     Estimated blood loss: none. Impression:               - Patent end-to-side ileo-colonic anastomosis,                            characterized by healthy appearing mucosa.                           - One 5 mm polyp in the sigmoid colon, removed with                            a cold snare. Resected and retrieved.                           - The examination was otherwise normal on direct                            and retroflexion  views. Recommendation:           - Repeat colonoscopy, likely in 2 years, for                            surveillance based on pathology results.                           - Patient has a contact number available for                            emergencies. The signs and symptoms of potential                            delayed complications were discussed with the                            patient. Return to normal activities tomorrow.  Written discharge instructions were provided to the                            patient.                           - Resume previous diet.                           - Continue present medications.                           - Increase dicyclomine to 10 mg po qid prn                            abdominal pain, bloating, 1 year of refills.                           - Await pathology results.                           - GI office appt with me or Tye Savoy, NP in 1                            month. Ladene Artist, MD 12/16/2020 12:15:05 PM This report has been signed electronically.

## 2020-12-16 NOTE — Progress Notes (Signed)
PT taken to PACU. Monitors in place. VSS. Report given to RN. 

## 2020-12-16 NOTE — Patient Instructions (Signed)
YOU HAD AN ENDOSCOPIC PROCEDURE TODAY AT [facility]:   Refer to the procedure report that was given to you for any specific questions about what was found during the examination.  If the procedure report does not answer your questions, please call your gastroenterologist to clarify.  If you requested that your care partner not be given the details of your procedure findings, then the procedure report has been included in a sealed envelope for you to review at your convenience later.  YOU SHOULD EXPECT: Some feelings of bloating in the abdomen. Passage of more gas than usual.  Walking can help get rid of the air that was put into your GI tract during the procedure and reduce the bloating. If you had a lower endoscopy (such as a colonoscopy or flexible sigmoidoscopy) you may notice spotting of blood in your stool or on the toilet paper. If you underwent a bowel prep for your procedure, you may not have a normal bowel movement for a few days.  Please Note:  You might notice some irritation and congestion in your nose or some drainage.  This is from the oxygen used during your procedure.  There is no need for concern and it should clear up in a day or so.  SYMPTOMS TO REPORT IMMEDIATELY:   Following lower endoscopy (colonoscopy or flexible sigmoidoscopy):  Excessive amounts of blood in the stool  Significant tenderness or worsening of abdominal pains  Swelling of the abdomen that is new, acute  Fever of 100F or higher   For urgent or emergent issues, a gastroenterologist can be reached at any hour by calling [phone]. Do not use MyChart messaging for urgent concerns.    DIET:  We do recommend a small meal at first, but then you may proceed to your regular diet.  Drink plenty of fluids but you should avoid alcoholic beverages for 24 hours.  MEDICATIONS: Continue present medications. Increase dicyclomine to 10 mg by mouth 4 times daily as needed for abdominal pain and  bloating (with refills for 1 year).  FOLLOW UP: Follow up with Dr. Fuller Plan or Tye Savoy, NP, in the office in 1 month.   Please see handouts given to you by your recovery nurse.  Thank you for allowing [place] to provide for your healthcare needs today.  ACTIVITY:  You should plan to take it easy for the rest of today and you should NOT DRIVE or use heavy machinery until tomorrow (because of the sedation medicines used during the test).    FOLLOW UP: Our staff will call the number listed on your records 48-72 hours following your procedure to check on you and address any questions or concerns that you may have regarding the information given to you following your procedure. If we do not reach you, we will leave a message.  We will attempt to reach you two times.  During this call, we will ask if you have developed any symptoms of COVID 19. If you develop any symptoms (ie: fever, flu-like symptoms, shortness of breath, cough etc.) before then, please call [phone].  If you test positive for Covid 19 in the 2 weeks post procedure, please call and report this information to [place].    If any biopsies were taken you will be contacted by phone or by letter within the next 1-3 weeks.  Please call us at [phone] if you have not heard about the biopsies in 3 weeks.    SIGNATURES/CONFIDENTIALITY: You and/or your care  partner have signed paperwork which will be entered into your electronic medical record.  These signatures attest to the fact that that the information above on your After Visit Summary has been reviewed and is understood.  Full responsibility of the confidentiality of this discharge information lies with you and/or your care-partner.

## 2020-12-18 ENCOUNTER — Telehealth: Payer: Self-pay | Admitting: *Deleted

## 2020-12-18 NOTE — Telephone Encounter (Signed)
  Follow up Call-  Call back number 12/16/2020 10/22/2019  Post procedure Call Back phone  # [phone]  Permission to leave phone message Yes Yes  Some recent data might be hidden     Patient questions:  Do you have a fever, pain , or abdominal swelling? No. Pain Score  0 *  Have you tolerated food without any problems? Yes.    Have you been able to return to your normal activities? Yes.    Do you have any questions about your discharge instructions: Diet   No. Medications  No. Follow up visit  No.  Do you have questions or concerns about your Care? No.  Actions: * If pain score is 4 or above: 1. No action needed, pain <4.Have you developed a fever since your procedure? no  2.   Have you had an respiratory symptoms (SOB or cough) since your procedure? no  3.   Have you tested positive for COVID 19 since your procedure no  4.   Have you had any family members/close contacts diagnosed with the COVID 19 since your procedure?  no   If yes to any of these questions please route to Joylene John, RN and Joella Prince, RN

## 2020-12-30 ENCOUNTER — Encounter: Payer: Self-pay | Admitting: Gastroenterology

## 2020-12-30 ENCOUNTER — Other Ambulatory Visit: Payer: Self-pay | Admitting: General Surgery

## 2020-12-30 DIAGNOSIS — Z85038 Personal history of other malignant neoplasm of large intestine: Secondary | ICD-10-CM

## 2020-12-31 ENCOUNTER — Other Ambulatory Visit: Payer: Self-pay | Admitting: Nurse Practitioner

## 2021-01-09 ENCOUNTER — Ambulatory Visit
Admission: RE | Admit: 2021-01-09 | Discharge: 2021-01-09 | Disposition: A | Discharge status: Home / Self Care | Payer: Commercial Managed Care - PPO | Source: Ambulatory Visit | Attending: General Surgery | Admitting: General Surgery

## 2021-01-09 ENCOUNTER — Other Ambulatory Visit: Payer: Self-pay

## 2021-01-09 DIAGNOSIS — Z85038 Personal history of other malignant neoplasm of large intestine: Secondary | ICD-10-CM

## 2021-02-11 NOTE — Progress Notes (Signed)
Records and referral request faxed to [facility].

## 2022-11-02 ENCOUNTER — Other Ambulatory Visit: Payer: Self-pay | Admitting: *Deleted

## 2022-11-02 DIAGNOSIS — R112 Nausea with vomiting, unspecified: Secondary | ICD-10-CM

## 2022-11-18 ENCOUNTER — Encounter: Payer: Self-pay | Admitting: Gastroenterology

## 2022-11-26 ENCOUNTER — Ambulatory Visit
Admission: RE | Admit: 2022-11-26 | Discharge: 2022-11-26 | Disposition: A | Discharge status: Home / Self Care | Payer: Commercial Managed Care - PPO | Source: Ambulatory Visit | Attending: *Deleted | Admitting: *Deleted

## 2022-11-26 DIAGNOSIS — R112 Nausea with vomiting, unspecified: Secondary | ICD-10-CM

## 2023-05-17 ENCOUNTER — Other Ambulatory Visit: Payer: Self-pay | Admitting: *Deleted

## 2023-05-17 DIAGNOSIS — R221 Localized swelling, mass and lump, neck: Secondary | ICD-10-CM

## 2023-05-25 ENCOUNTER — Ambulatory Visit
Admission: RE | Admit: 2023-05-25 | Discharge: 2023-05-25 | Disposition: A | Discharge status: Home / Self Care | Payer: Commercial Managed Care - PPO | Source: Ambulatory Visit | Attending: *Deleted | Admitting: *Deleted

## 2023-05-25 DIAGNOSIS — R221 Localized swelling, mass and lump, neck: Secondary | ICD-10-CM
